# Patient Record
Sex: Male | Born: 1995 | Race: Black or African American | Hispanic: No | Marital: Single | Smoking: Current every day smoker
Health system: Southern US, Community
[De-identification: ages and names within clinical notes are randomized; demographics above are authoritative.]

## PROBLEM LIST (undated history)

## (undated) DIAGNOSIS — J45909 Unspecified asthma, uncomplicated: Secondary | ICD-10-CM

---
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
## 2014-10-02 ENCOUNTER — Emergency Department (HOSPITAL_COMMUNITY)
Admission: EM | Admit: 2014-10-02 | Discharge: 2014-10-03 | Disposition: A | Discharge status: Home / Self Care | Payer: BLUE CROSS/BLUE SHIELD | Attending: Emergency Medicine | Admitting: Emergency Medicine

## 2014-10-02 ENCOUNTER — Encounter (HOSPITAL_COMMUNITY): Payer: Self-pay | Admitting: *Deleted

## 2014-10-02 DIAGNOSIS — X58XXXA Exposure to other specified factors, initial encounter: Secondary | ICD-10-CM | POA: Diagnosis not present

## 2014-10-02 DIAGNOSIS — T148 Other injury of unspecified body region: Secondary | ICD-10-CM | POA: Insufficient documentation

## 2014-10-02 DIAGNOSIS — Z720 Tobacco use: Secondary | ICD-10-CM | POA: Insufficient documentation

## 2014-10-02 DIAGNOSIS — Z791 Long term (current) use of non-steroidal anti-inflammatories (NSAID): Secondary | ICD-10-CM | POA: Insufficient documentation

## 2014-10-02 DIAGNOSIS — T148XXA Other injury of unspecified body region, initial encounter: Secondary | ICD-10-CM

## 2014-10-02 DIAGNOSIS — S4992XA Unspecified injury of left shoulder and upper arm, initial encounter: Secondary | ICD-10-CM | POA: Insufficient documentation

## 2014-10-02 DIAGNOSIS — Y9389 Activity, other specified: Secondary | ICD-10-CM | POA: Diagnosis not present

## 2014-10-02 DIAGNOSIS — S4991XA Unspecified injury of right shoulder and upper arm, initial encounter: Secondary | ICD-10-CM | POA: Diagnosis not present

## 2014-10-02 DIAGNOSIS — Z79899 Other long term (current) drug therapy: Secondary | ICD-10-CM | POA: Insufficient documentation

## 2014-10-02 DIAGNOSIS — J45909 Unspecified asthma, uncomplicated: Secondary | ICD-10-CM | POA: Diagnosis not present

## 2014-10-02 DIAGNOSIS — Y998 Other external cause status: Secondary | ICD-10-CM | POA: Diagnosis not present

## 2014-10-02 DIAGNOSIS — S3992XA Unspecified injury of lower back, initial encounter: Secondary | ICD-10-CM | POA: Insufficient documentation

## 2014-10-02 DIAGNOSIS — Y9289 Other specified places as the place of occurrence of the external cause: Secondary | ICD-10-CM | POA: Diagnosis not present

## 2014-10-02 NOTE — ED Provider Notes (Signed)
CSN: [id number]     Arrival date & time 10/02/14  2244 History   First MD Initiated Contact with Patient 10/02/14 2339     Chief Complaint  Patient presents with  . Back Pain     (Consider location/radiation/quality/duration/timing/severity/associated sxs/prior Treatment) Patient is a 19 y.o. male presenting with back pain. The history is provided by the patient.  Back Pain Pain location: right and left shoulders/upper back. Quality:  Aching Pain severity:  Moderate Duration:  2 weeks Timing:  Intermittent Progression:  Unchanged Chronicity:  New Context: lifting heavy objects   Relieved by:  Nothing Worsened by:  Movement Ineffective treatments:  Lying down Associated symptoms: no abdominal pain, no bladder incontinence, no bowel incontinence, no chest pain, no dysuria, no paresthesias, no perianal numbness and no weakness   Risk factors: no recent surgery     Past Medical History  Diagnosis Date  . Asthma    History reviewed. No pertinent past surgical history. History reviewed. No pertinent family history. History  Substance Use Topics  . Smoking status: Current Every Day Smoker -- 1.00 packs/day  . Smokeless tobacco: Not on file  . Alcohol Use: No    Review of Systems  Constitutional: Negative for activity change.       All ROS Neg except as noted in HPI  HENT: Negative for nosebleeds.   Eyes: Negative for photophobia and discharge.  Respiratory: Negative for cough, shortness of breath and wheezing.   Cardiovascular: Negative for chest pain and palpitations.  Gastrointestinal: Negative for abdominal pain, blood in stool and bowel incontinence.  Genitourinary: Negative for bladder incontinence, dysuria, frequency and hematuria.  Musculoskeletal: Positive for back pain. Negative for arthralgias and neck pain.  Skin: Negative.   Neurological: Negative for dizziness, seizures, speech difficulty, weakness and paresthesias.  Psychiatric/Behavioral: Negative for  hallucinations and confusion.      Allergies  Review of patient's allergies indicates no known allergies.  Home Medications   Prior to Admission medications   Medication Sig Start Date End Date Taking? Authorizing Provider  HYDROcodone-acetaminophen (NORCO) 5-325 MG per tablet Take 1 tablet by mouth every 4 (four) hours as needed for pain. 02/15/12   Ivery Quale, PA-C  ibuprofen (ADVIL,MOTRIN) 800 MG tablet Take 1 tablet (800 mg total) by mouth 3 (three) times daily. 02/15/12   Ivery Quale, PA-C   BP 150/70 mmHg  Pulse 62  Temp(Src) 98.3 F (36.8 C) (Oral)  Resp 18  Ht  (1.727 m)  Wt 230 lb (104.327 kg)  BMI 34.98 kg/m2  SpO2 100% Physical Exam  Constitutional: He is oriented to person, place, and time. He appears well-developed and well-nourished.  Non-toxic appearance.  HENT:  Head: Normocephalic.  Right Ear: Tympanic membrane and external ear normal.  Left Ear: Tympanic membrane and external ear normal.  Eyes: EOM and lids are normal. Pupils are equal, round, and reactive to light.  Neck: Normal range of motion. Neck supple. Carotid bruit is not present.  Cardiovascular: Normal rate, regular rhythm, normal heart sounds, intact distal pulses and normal pulses.   Pulmonary/Chest: Breath sounds normal. No respiratory distress.  Abdominal: Soft. Bowel sounds are normal. There is no tenderness. There is no guarding.  Musculoskeletal: Normal range of motion.       Back:  Pain of both shoulders, radiating to the axilla area. No step off of the cervical or thoracic spine.  Lymphadenopathy:       Head (right side): No submandibular adenopathy present.  Head (left side): No submandibular adenopathy present.    He has no cervical adenopathy.  Neurological: He is alert and oriented to person, place, and time. He has normal strength. No cranial nerve deficit or sensory deficit.  Skin: Skin is warm and dry.  Psychiatric: He has a normal mood and affect. His speech is  normal.  Nursing note and vitals reviewed.   ED Course  Procedures (including critical care time) Labs Review Labs Reviewed - No data to display  Imaging Review No results found.   EKG Interpretation None      MDM  Exam favors Musculoskeletal pain. No gross neuro deficit.  Rx for ibuprofen and baclofen given to the patient. Pt to follow up with orthopedics if not improving.   Final diagnoses:  None    *I have reviewed nursing notes, vital signs, and all appropriate lab and imaging results for this patient.[address]    Jodey Burbano, PA-C 10/03/14 0038  Shon Batonourtney F Horton, MD 10/03/14 [phone]

## 2014-10-02 NOTE — ED Notes (Signed)
Pt reporting "discomfort" in lower to mid back.  Pt states he's had this pain "for a few weeks".

## 2014-10-03 MED ORDER — PREDNISONE 50 MG PO TABS
60.0000 mg | ORAL_TABLET | Freq: Once | ORAL | Status: AC
  Administered 2014-10-03: 60 mg via ORAL
  Filled 2014-10-03 (×2): qty 1

## 2014-10-03 MED ORDER — METHOCARBAMOL 500 MG PO TABS: 500.0000 mg | ORAL_TABLET | Freq: Three times a day (TID) | ORAL | Status: DC

## 2014-10-03 MED ORDER — IBUPROFEN 800 MG PO TABS: 800.0000 mg | ORAL_TABLET | Freq: Three times a day (TID) | ORAL | Status: DC

## 2014-10-03 MED ORDER — IBUPROFEN 800 MG PO TABS
800.0000 mg | ORAL_TABLET | Freq: Once | ORAL | Status: AC
  Administered 2014-10-03: 800 mg via ORAL
  Filled 2014-10-03: qty 1

## 2014-10-03 NOTE — ED Notes (Signed)
Discharge instructions and prescriptions given and reviewed with patient.  Patient verbalized understanding to take medication as directed and possible sedating effect of Robaxin.  Patient ambulatory; discharged home in good condition.

## 2014-10-03 NOTE — Discharge Instructions (Signed)

## 2015-02-22 ENCOUNTER — Ambulatory Visit (INDEPENDENT_AMBULATORY_CARE_PROVIDER_SITE_OTHER): Payer: BLUE CROSS/BLUE SHIELD

## 2015-02-22 ENCOUNTER — Ambulatory Visit (INDEPENDENT_AMBULATORY_CARE_PROVIDER_SITE_OTHER): Payer: BLUE CROSS/BLUE SHIELD | Admitting: Orthopedic Surgery

## 2015-02-22 VITALS — BP 145/98 | Ht 68.0 in | Wt 236.0 lb

## 2015-02-22 DIAGNOSIS — M545 Low back pain, unspecified: Secondary | ICD-10-CM | POA: Insufficient documentation

## 2015-02-22 DIAGNOSIS — M25561 Pain in right knee: Secondary | ICD-10-CM

## 2015-02-22 DIAGNOSIS — M222X1 Patellofemoral disorders, right knee: Secondary | ICD-10-CM | POA: Diagnosis not present

## 2015-02-22 DIAGNOSIS — Q666 Other congenital valgus deformities of feet: Secondary | ICD-10-CM

## 2015-02-22 NOTE — Patient Instructions (Signed)
Home exercises  Orthotics from [facility]
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
## 2015-08-18 ENCOUNTER — Emergency Department (HOSPITAL_COMMUNITY)
Admission: EM | Admit: 2015-08-18 | Discharge: 2015-08-18 | Disposition: A | Discharge status: Home / Self Care | Payer: No Typology Code available for payment source | Attending: Emergency Medicine | Admitting: Emergency Medicine

## 2015-08-18 ENCOUNTER — Encounter (HOSPITAL_COMMUNITY): Payer: Self-pay | Admitting: Emergency Medicine

## 2015-08-18 DIAGNOSIS — Y999 Unspecified external cause status: Secondary | ICD-10-CM | POA: Insufficient documentation

## 2015-08-18 DIAGNOSIS — X500XXA Overexertion from strenuous movement or load, initial encounter: Secondary | ICD-10-CM | POA: Insufficient documentation

## 2015-08-18 DIAGNOSIS — M545 Low back pain, unspecified: Secondary | ICD-10-CM

## 2015-08-18 DIAGNOSIS — T148XXA Other injury of unspecified body region, initial encounter: Secondary | ICD-10-CM

## 2015-08-18 DIAGNOSIS — Y929 Unspecified place or not applicable: Secondary | ICD-10-CM | POA: Insufficient documentation

## 2015-08-18 DIAGNOSIS — J45909 Unspecified asthma, uncomplicated: Secondary | ICD-10-CM | POA: Insufficient documentation

## 2015-08-18 DIAGNOSIS — F17200 Nicotine dependence, unspecified, uncomplicated: Secondary | ICD-10-CM | POA: Insufficient documentation

## 2015-08-18 DIAGNOSIS — Y93F2 Activity, caregiving, lifting: Secondary | ICD-10-CM | POA: Insufficient documentation

## 2015-08-18 DIAGNOSIS — S39012A Strain of muscle, fascia and tendon of lower back, initial encounter: Secondary | ICD-10-CM | POA: Insufficient documentation

## 2015-08-18 MED ORDER — DICLOFENAC SODIUM 75 MG PO TBEC: 75.0000 mg | DELAYED_RELEASE_TABLET | Freq: Two times a day (BID) | ORAL | Status: DC

## 2015-08-18 MED ORDER — METHOCARBAMOL 500 MG PO TABS: ORAL_TABLET | ORAL | Status: DC

## 2015-08-18 NOTE — ED Provider Notes (Signed)
CSN: [id number]     Arrival date & time 08/18/15  1720 History   First MD Initiated Contact with Patient 08/18/15 1741     Chief Complaint  Patient presents with  . Back Pain     (Consider location/radiation/quality/duration/timing/severity/associated sxs/prior Treatment) HPI Comments: Patient is a 20 year old male who presents to the emergency department with bilateral lower back pain.  The patient states that he has had this problem in the past,, and at that time it was believed to be related to lifting, pushing, and pulling at work. He states that the pain has returned, it Them up on last evening. He presents to the emergency department for additional evaluation. He continues to do a lot of heavy lifting on his job. He is ambulatory without problem. He denies any loss of bowel or bladder function. He's not had any frequent falls. Standing, walking, and turning same to aggravate the pain. He has not done anything that really stops the pain.  The history is provided by the patient.    Past Medical History  Diagnosis Date  . Asthma    History reviewed. No pertinent past surgical history. No family history on file. Social History  Substance Use Topics  . Smoking status: Current Every Day Smoker -- 1.00 packs/day  . Smokeless tobacco: None  . Alcohol Use: No    Review of Systems  Constitutional: Negative for activity change.       All ROS Neg except as noted in HPI  HENT: Negative for nosebleeds.   Eyes: Negative for photophobia and discharge.  Respiratory: Negative for cough, shortness of breath and wheezing.   Cardiovascular: Negative for chest pain and palpitations.  Gastrointestinal: Negative for abdominal pain and blood in stool.  Genitourinary: Negative for dysuria, frequency and hematuria.  Musculoskeletal: Positive for back pain. Negative for arthralgias and neck pain.  Skin: Negative.   Neurological: Negative for dizziness, seizures and speech difficulty.    Psychiatric/Behavioral: Negative for hallucinations and confusion.  All other systems reviewed and are negative.     Allergies  Review of patient's allergies indicates no known allergies.  Home Medications   Prior to Admission medications   Not on File   BP 126/79 mmHg  Pulse 57  Temp(Src) 98.5 F (36.9 C) (Oral)  Resp 14  Ht  (1.727 m)  Wt 90.719 kg  BMI 30.42 kg/m2  SpO2 100% Physical Exam  Constitutional: He is oriented to person, place, and time. He appears well-developed and well-nourished.  Non-toxic appearance.  HENT:  Head: Normocephalic.  Right Ear: Tympanic membrane and external ear normal.  Left Ear: Tympanic membrane and external ear normal.  Eyes: EOM and lids are normal. Pupils are equal, round, and reactive to light.  Neck: Normal range of motion. Neck supple. Carotid bruit is not present.  Cardiovascular: Normal rate, regular rhythm, normal heart sounds, intact distal pulses and normal pulses.   Pulmonary/Chest: Breath sounds normal. No respiratory distress.  Abdominal: Soft. Bowel sounds are normal. There is no tenderness. There is no guarding.  Musculoskeletal: Normal range of motion.       Arms: No palpable step off of the cervical, thoracic, or lumbar spine. No hot areas appreciated.  Lymphadenopathy:       Head (right side): No submandibular adenopathy present.       Head (left side): No submandibular adenopathy present.    He has no cervical adenopathy.  Neurological: He is alert and oriented to person, place, and time. He has normal strength.  No cranial nerve deficit or sensory deficit. Coordination and gait normal.  Skin: Skin is warm and dry.  Psychiatric: He has a normal mood and affect. His speech is normal.  Nursing note and vitals reviewed.   ED Course  Procedures (including critical care time) Labs Review Labs Reviewed - No data to display  Imaging Review No results found. I have personally reviewed and evaluated these images  and lab results as part of my medical decision-making.   EKG Interpretation None      MDM  Vital signs are well within normal limits. The gait is steady, there no gross neurologic deficits appreciated. There is no weaknesses noted of the upper or lower extremities. The examination favors muscle strain of the right greater than left back areas.  I suggested a warm tub soaks. The patient is given a prescription for Robaxin to use at bedtime, or every 8 hours for spasm pain. He is also treated with diclofenac 2 times daily. He will follow-up with orthopedics if not improving.    Final diagnoses:  None    **I have reviewed nursing notes, vital signs, and all appropriate lab and imaging results for this patient.Caleb Blevins*    Caleb Pint, PA-C 08/18/15 1850  Caleb QualeHobson Mike Hamre, PA-C 08/18/15 1851  Lavera Guiseana Duo Liu, MD 08/19/15 1357

## 2015-08-18 NOTE — ED Notes (Signed)
Pt reports bilateral lower back pain. States he does a lot of heavy lifting with his job. Pt ambulatory.

## 2015-08-18 NOTE — Discharge Instructions (Signed)
Your examination favors muscle strain involving the muscles of your back extending into the shoulder blade area. Warm tub soaks may be helpful. Please use diclofenac 2 times daily with food until all taken. Use Robaxin at bedtime, or every 8 hours if you are not required to drive, work, Advertising copywriteroperate machinery, or handle legal documents. Musculoskeletal Pain Musculoskeletal pain is muscle and boney aches and pains. These pains can occur in any part of the body. Your caregiver may treat you without knowing the cause of the pain. They may treat you if blood or urine tests, X-rays, and other tests were normal.  CAUSES There is often not a definite cause or reason for these pains. These pains may be caused by a type of germ (virus). The discomfort may also come from overuse. Overuse includes working out too hard when your body is not fit. Boney aches also come from weather changes. Bone is sensitive to atmospheric pressure changes. HOME CARE INSTRUCTIONS   Ask when your test results will be ready. Make sure you get your test results.  Only take over-the-counter or prescription medicines for pain, discomfort, or fever as directed by your caregiver. If you were given medications for your condition, do not drive, operate machinery or power tools, or sign legal documents for 24 hours. Do not drink alcohol. Do not take sleeping pills or other medications that may interfere with treatment.  Continue all activities unless the activities cause more pain. When the pain lessens, slowly resume normal activities. Gradually increase the intensity and duration of the activities or exercise.  During periods of severe pain, bed rest may be helpful. Lay or sit in any position that is comfortable.  Putting ice on the injured area.  Put ice in a bag.  Place a towel between your skin and the bag.  Leave the ice on for 15 to 20 minutes, 3 to 4 times a day.  Follow up with your caregiver for continued problems and no reason  can be found for the pain. If the pain becomes worse or does not go away, it may be necessary to repeat tests or do additional testing. Your caregiver may need to look further for a possible cause. SEEK IMMEDIATE MEDICAL CARE IF:  You have pain that is getting worse and is not relieved by medications.  You develop chest pain that is associated with shortness or breath, sweating, feeling sick to your stomach (nauseous), or throw up (vomit).  Your pain becomes localized to the abdomen.  You develop any new symptoms that seem different or that concern you. MAKE SURE YOU:   Understand these instructions.  Will watch your condition.  Will get help right away if you are not doing well or get worse.   This information is not intended to replace advice given to you by your health care provider. Make sure you discuss any questions you have with your health care provider.   Document Released: 04/30/2005 Document Revised: 07/23/2011 Document Reviewed: 01/02/2013 Elsevier Interactive Patient Education [employer]2016 [employer]

## 2015-09-25 ENCOUNTER — Encounter (HOSPITAL_COMMUNITY): Payer: Self-pay | Admitting: Emergency Medicine

## 2015-09-25 ENCOUNTER — Emergency Department (HOSPITAL_COMMUNITY)
Admission: EM | Admit: 2015-09-25 | Discharge: 2015-09-26 | Disposition: A | Discharge status: Home / Self Care | Payer: Self-pay | Attending: Emergency Medicine | Admitting: Emergency Medicine

## 2015-09-25 ENCOUNTER — Emergency Department (HOSPITAL_COMMUNITY): Payer: Self-pay

## 2015-09-25 DIAGNOSIS — Y929 Unspecified place or not applicable: Secondary | ICD-10-CM | POA: Insufficient documentation

## 2015-09-25 DIAGNOSIS — F17200 Nicotine dependence, unspecified, uncomplicated: Secondary | ICD-10-CM | POA: Insufficient documentation

## 2015-09-25 DIAGNOSIS — J45909 Unspecified asthma, uncomplicated: Secondary | ICD-10-CM | POA: Insufficient documentation

## 2015-09-25 DIAGNOSIS — Y9389 Activity, other specified: Secondary | ICD-10-CM | POA: Insufficient documentation

## 2015-09-25 DIAGNOSIS — X501XXA Overexertion from prolonged static or awkward postures, initial encounter: Secondary | ICD-10-CM | POA: Insufficient documentation

## 2015-09-25 DIAGNOSIS — S43102A Unspecified dislocation of left acromioclavicular joint, initial encounter: Secondary | ICD-10-CM | POA: Insufficient documentation

## 2015-09-25 DIAGNOSIS — Y999 Unspecified external cause status: Secondary | ICD-10-CM | POA: Insufficient documentation

## 2015-09-25 MED ORDER — HYDROCODONE-ACETAMINOPHEN 5-325 MG PO TABS: 1.0000 | ORAL_TABLET | ORAL | Status: DC | PRN

## 2015-09-25 MED ORDER — DICLOFENAC SODIUM 50 MG PO TBEC: 50.0000 mg | DELAYED_RELEASE_TABLET | Freq: Two times a day (BID) | ORAL | Status: DC

## 2015-09-25 MED ORDER — METHOCARBAMOL 500 MG PO TABS: 500.0000 mg | ORAL_TABLET | Freq: Two times a day (BID) | ORAL | Status: DC

## 2015-09-25 NOTE — ED Provider Notes (Signed)
CSN: [id number]     Arrival date & time 09/25/15  2147 History   First MD Initiated Contact with Patient 09/25/15 2226     Chief Complaint  Patient presents with  . Shoulder Pain     (Consider location/radiation/quality/duration/timing/severity/associated sxs/prior Treatment) Patient is a 20 y.o. male presenting with shoulder pain. The history is provided by the patient. No language interpreter was used.  Shoulder Pain Location:  Shoulder Time since incident:  1 week Injury: yes   Mechanism of injury comment:  Lifting Shoulder location:  L shoulder Pain details:    Quality:  Dull and aching   Radiates to:  Back   Severity:  Moderate   Onset quality:  Gradual   Progression:  Worsening Chronicity:  New Handedness:  Right-handed Dislocation: no   Foreign body present:  No foreign bodies Tetanus status:  Unknown Prior injury to area:  No Relieved by:  None tried Worsened by:  Movement Ineffective treatments:  None tried  Denice ParadiseChristopher L Dearden is a 20 y.o. male who presents to the ED with left shoulder pain that started one week ago after he was helping someone move furniture. He reports that he has taken nothing for the pain. The pain seemed to get worse when he went back to work where he does a lot of lifting.   Past Medical History  Diagnosis Date  . Asthma    History reviewed. No pertinent past surgical history. History reviewed. No pertinent family history. Social History  Substance Use Topics  . Smoking status: Current Every Day Smoker -- 1.00 packs/day  . Smokeless tobacco: None  . Alcohol Use: Yes     Comment: occasionally    Review of Systems Negative except as stated in HPI   Allergies  Review of patient's allergies indicates no known allergies.  Home Medications   Prior to Admission medications   Medication Sig Start Date End Date Taking? Authorizing Provider  diclofenac (VOLTAREN) 50 MG EC tablet Take 1 tablet (50 mg total) by mouth 2 (two) times  daily. 09/25/15   Chanette Demo Orlene OchM Vollie Brunty, NP  HYDROcodone-acetaminophen (NORCO/VICODIN) 5-325 MG tablet Take 1 tablet by mouth every 4 (four) hours as needed. 09/25/15   Quiera Diffee Orlene OchM Rethel Sebek, NP  methocarbamol (ROBAXIN) 500 MG tablet Take 1 tablet (500 mg total) by mouth 2 (two) times daily. 09/25/15   Jeffren Dombek Orlene OchM Makinzy Cleere, NP   BP 149/76 mmHg  Pulse 65  Temp(Src) 98.4 F (36.9 C) (Oral)  Resp 14  Ht 5\' 8"  (1.727 m)  Wt 88.451 kg  BMI 29.66 kg/m2  SpO2 100% Physical Exam  Constitutional: He is oriented to person, place, and time. He appears well-developed and well-nourished.  HENT:  Head: Normocephalic and atraumatic.  Eyes: EOM are normal.  Neck: Normal range of motion. Neck supple.  Cardiovascular: Normal rate and regular rhythm.   Pulmonary/Chest: Effort normal and breath sounds normal.  Musculoskeletal:       Left shoulder: He exhibits tenderness and spasm. He exhibits normal range of motion, no swelling, no crepitus, no deformity, no laceration, normal pulse and normal strength.       Arms: Radial pulses 2+, adequate circulation  Neurological: He is alert and oriented to person, place, and time. No cranial nerve deficit.  Skin: Skin is warm and dry.  Psychiatric: He has a normal mood and affect. His behavior is normal.  Nursing note and vitals reviewed.   ED Course  Procedures (including critical care time) Labs Review Labs Reviewed - No data  to display  Imaging Review Dg Shoulder Left  09/25/2015  CLINICAL DATA:  Left shoulder pain after injury a week ago. EXAM: LEFT SHOULDER - 2+ VIEW COMPARISON:  None. FINDINGS: There is slight widening of the acromioclavicular joint which may represent low grade acromioclavicular separation. Coracoclavicular spaces maintained. No evidence of acute fracture or glenohumeral dislocation otherwise identified. No focal bone lesion or bone destruction. Soft tissues are unremarkable. IMPRESSION: Slight widening of the acromioclavicular joint possibly represent low grade  acromioclavicular separation. Repeat views with weight-bearing may be useful in further evaluation depending on clinical suspicion. Electronically Signed   By: Burman Nieves M.D.   On: 09/25/2015 23:24   I have personally reviewed and evaluated these images as part of my medical decision-making.   MDM  21 y.o. male with persistent pain to the left shoulder after helping a friend move heavy furniture one week ago stable for d/c without focal neuro deficits. Shoulder immobilizer applied, ice, pain management and f/u with ortho for further evaluation of AC separation. Discussed with the patient clinical and x-ray findings and plan of care. All questioned fully answered. He will f/u with ortho or return here if any problems arise.   Final diagnoses:  AC separation, left, initial encounter       [facility] NP 09/26/15 0006  Loren Racer, MD 09/29/15 [id number]

## 2015-09-25 NOTE — Discharge Instructions (Signed)
Do not take the muscle relaxant or narcotic pain medication if you are driving or working. Wear the shoulder immobilizer for comfort. Apply ice and rest the area. Follow up with Dr. Romeo AppleHarrison.

## 2015-09-25 NOTE — ED Notes (Signed)
Patient complaining of left shoulder pain after helping move furniture 1 week ago.

## 2015-09-26 MED ORDER — HYDROCODONE-ACETAMINOPHEN 5-325 MG PO TABS
1.0000 | ORAL_TABLET | Freq: Once | ORAL | Status: AC
  Administered 2015-09-26: 1 via ORAL
  Filled 2015-09-26: qty 1

## 2015-09-26 NOTE — ED Notes (Signed)
Pt alert & oriented x4, stable gait. Patient given discharge instructions, paperwork & prescription(s). Patient informed not to drive, operate any equipment & handel any important documents 4 hours after taking pain medication. Patient  instructed to stop at the registration desk to finish any additional paperwork. Patient  verbalized understanding. Pt left department w/ no further questions. 

## 2015-10-08 ENCOUNTER — Emergency Department (HOSPITAL_COMMUNITY)
Admission: EM | Admit: 2015-10-08 | Discharge: 2015-10-08 | Disposition: A | Discharge status: Home / Self Care | Payer: Self-pay | Attending: Emergency Medicine | Admitting: Emergency Medicine

## 2015-10-08 ENCOUNTER — Encounter (HOSPITAL_COMMUNITY): Payer: Self-pay

## 2015-10-08 DIAGNOSIS — Y929 Unspecified place or not applicable: Secondary | ICD-10-CM | POA: Insufficient documentation

## 2015-10-08 DIAGNOSIS — J45909 Unspecified asthma, uncomplicated: Secondary | ICD-10-CM | POA: Insufficient documentation

## 2015-10-08 DIAGNOSIS — Y939 Activity, unspecified: Secondary | ICD-10-CM | POA: Insufficient documentation

## 2015-10-08 DIAGNOSIS — Y999 Unspecified external cause status: Secondary | ICD-10-CM | POA: Insufficient documentation

## 2015-10-08 DIAGNOSIS — S43102D Unspecified dislocation of left acromioclavicular joint, subsequent encounter: Secondary | ICD-10-CM | POA: Insufficient documentation

## 2015-10-08 DIAGNOSIS — X500XXD Overexertion from strenuous movement or load, subsequent encounter: Secondary | ICD-10-CM | POA: Insufficient documentation

## 2015-10-08 DIAGNOSIS — F17200 Nicotine dependence, unspecified, uncomplicated: Secondary | ICD-10-CM | POA: Insufficient documentation

## 2015-10-08 NOTE — ED Notes (Signed)
Helped someone move on 5/6 and came here on the 7th.  They said I had an AC separation.  Went back to work on the Thursday after that.  Started working again that following Monday, it has been bothering me since then.

## 2015-10-08 NOTE — ED Provider Notes (Signed)
CSN: [id number]     Arrival date & time 10/08/15  1952 History   First MD Initiated Contact with Patient 10/08/15 2005     Chief Complaint  Patient presents with  . Shoulder Pain     (Consider location/radiation/quality/duration/timing/severity/associated sxs/prior Treatment) HPI    Caleb Blevins is a 20 y.o. male who presents to the Emergency Department complaining of continued left shoulder pain for several weeks.  He was seen here 09/18/15 after a lifting injury and [facility] an [facility] and went back to work after the injury which he states has aggravated his injury.  He also states that he hasn't been able to follow-up with an orthopedic doctor yet.  He is requesting a work note.  Continues to take his prescribed medications with some relief.  He denies shortness of breath, chest or neck pain, numbness or weakness of the UE's.     Past Medical History  Diagnosis Date  . Asthma    History reviewed. No pertinent past surgical history. No family history on file. Social History  Substance Use Topics  . Smoking status: Current Every Day Smoker -- 1.00 packs/day  . Smokeless tobacco: None  . Alcohol Use: Yes     Comment: occasionally    Review of Systems  Constitutional: Negative for fever and chills.  Respiratory: Negative for shortness of breath.   Cardiovascular: Negative for chest pain.  Musculoskeletal: Positive for arthralgias (left shoulder pain). Negative for joint swelling and neck pain.  Skin: Negative for color change and wound.  Neurological: Negative for weakness and numbness.  All other systems reviewed and are negative.     Allergies  Review of patient's allergies indicates no known allergies.  Home Medications   Prior to Admission medications   Medication Sig Start Date End Date Taking? Authorizing Provider  diclofenac (VOLTAREN) 50 MG EC tablet Take 1 tablet (50 mg total) by mouth 2 (two) times daily. 09/25/15   Hope Orlene OchM Neese, NP   HYDROcodone-acetaminophen (NORCO/VICODIN) 5-325 MG tablet Take 1 tablet by mouth every 4 (four) hours as needed. 09/25/15   Hope Orlene OchM Neese, NP  methocarbamol (ROBAXIN) 500 MG tablet Take 1 tablet (500 mg total) by mouth 2 (two) times daily. 09/25/15   Hope Orlene OchM Neese, NP   BP 141/68 mmHg  Pulse 82  Temp(Src) 98.6 F (37 C) (Oral)  Resp 16  Ht 5\' 8"  (1.727 m)  Wt 90.719 kg  BMI 30.42 kg/m2  SpO2 97% Physical Exam  Constitutional: He is oriented to person, place, and time. He appears well-developed and well-nourished. No distress.  HENT:  Head: Atraumatic.  Neck: Normal range of motion. Neck supple.  Cardiovascular: Normal rate, regular rhythm and intact distal pulses.   Pulmonary/Chest: Effort normal and breath sounds normal. No respiratory distress. He exhibits no tenderness.  Musculoskeletal: He exhibits tenderness. He exhibits no edema.  ttp of left anterior shoulder joint.  Pain reproduced through ROM.  Also tender at scapular border.  No edema.  Radial pulse brisk, sensation intact.  Grip strength strong and symmetrical  Neurological: He is alert and oriented to person, place, and time.  Skin: Skin is warm. No erythema.  Nursing note and vitals reviewed.   ED Course  Procedures (including critical care time) Labs Review Labs Reviewed - No data to display  Imaging Review No results found. I have personally reviewed and evaluated these images and lab results as part of my medical decision-making.   EKG Interpretation None  MDM   Final diagnoses:  Acromioclavicular joint separation, left, subsequent encounter    Previous imaging reviewed by me and considered in my MDM. Pt taking previous prescribed medications.  Here tonight requesting note for work.  Tender at left [facility] joint and left trapezius muscle.  No concerning sx's for septic joint or NV deficits.  Agrees to f/u with Dr. Chase Caller, PA-C 10/09/15 1347  Vanetta Mulders, MD 10/12/15 1700

## 2015-10-20 ENCOUNTER — Encounter (HOSPITAL_COMMUNITY): Payer: Self-pay

## 2015-10-20 ENCOUNTER — Emergency Department (HOSPITAL_COMMUNITY)
Admission: EM | Admit: 2015-10-20 | Discharge: 2015-10-20 | Disposition: A | Discharge status: Home / Self Care | Payer: Self-pay | Attending: Emergency Medicine | Admitting: Emergency Medicine

## 2015-10-20 DIAGNOSIS — M25512 Pain in left shoulder: Secondary | ICD-10-CM | POA: Insufficient documentation

## 2015-10-20 DIAGNOSIS — F17200 Nicotine dependence, unspecified, uncomplicated: Secondary | ICD-10-CM | POA: Insufficient documentation

## 2015-10-20 DIAGNOSIS — J45909 Unspecified asthma, uncomplicated: Secondary | ICD-10-CM | POA: Insufficient documentation

## 2015-10-20 MED ORDER — DICLOFENAC SODIUM 1 % TD GEL: 2.0000 g | Freq: Four times a day (QID) | TRANSDERMAL | Status: DC

## 2015-10-20 NOTE — ED Notes (Signed)
Dr. Hyacinth MeekerMiller in room to see pt.

## 2015-10-20 NOTE — ED Notes (Signed)
Pt reports was seen here 2 1/2 weeks ago for shoulder pain and wants to be reevaluated today to get a note for him to go back to work.  Pt says is still having pain in left shoulder.

## 2015-10-20 NOTE — Discharge Instructions (Signed)
You may return to work [employer]

## 2015-10-20 NOTE — ED Provider Notes (Signed)
CSN: [id number]     Arrival date & time 10/20/15  1804 History   First MD Initiated Contact with Patient 10/20/15 1924     Chief Complaint  Patient presents with  . Shoulder Pain     (Consider location/radiation/quality/duration/timing/severity/associated sxs/prior Treatment) HPI  The patient has shoulder pain on the left, this occurred after lifting something heavy approximately one month ago, he has ongoing pain which is gradually improving located in the left trapezius as well as the left latissimus dorsi around the back of the shoulder. He has minimal pain with range of motion of the shoulder. He states he needs to go back to work and wants a work note allowing him to return. He has not followed up with orthopedics  Past Medical History  Diagnosis Date  . Asthma    History reviewed. No pertinent past surgical history. No family history on file. Social History  Substance Use Topics  . Smoking status: Current Every Day Smoker -- 1.00 packs/day  . Smokeless tobacco: None  . Alcohol Use: Yes     Comment: occasionally    Review of Systems  Constitutional: Negative for fever.  Neurological: Negative for numbness.      Allergies  Review of patient's allergies indicates no known allergies.  Home Medications   Prior to Admission medications   Medication Sig Start Date End Date Taking? Authorizing Provider  diclofenac (VOLTAREN) 50 MG EC tablet Take 1 tablet (50 mg total) by mouth 2 (two) times daily. 09/25/15   Hope Orlene OchM Neese, NP  diclofenac sodium (VOLTAREN) 1 % GEL Apply 2 g topically 4 (four) times daily. 10/20/15   Eber HongBrian Babetta Paterson, MD  HYDROcodone-acetaminophen (NORCO/VICODIN) 5-325 MG tablet Take 1 tablet by mouth every 4 (four) hours as needed. 09/25/15   Hope Orlene OchM Neese, NP  methocarbamol (ROBAXIN) 500 MG tablet Take 1 tablet (500 mg total) by mouth 2 (two) times daily. 09/25/15   Hope Orlene OchM Neese, NP   BP 136/77 mmHg  Pulse 61  Temp(Src) 98.6 F (37 C) (Oral)  Resp 14  Ht 5\' 8"   (1.727 m)  Wt 200 lb (90.719 kg)  BMI 30.42 kg/m2  SpO2 100% Physical Exam  Constitutional: He appears well-developed and well-nourished.  HENT:  Head: Normocephalic and atraumatic.  Eyes: Conjunctivae are normal. Right eye exhibits no discharge. Left eye exhibits no discharge.  Pulmonary/Chest: Effort normal. No respiratory distress.  Musculoskeletal: He exhibits tenderness ( No tenderness over the acromioclavicular joint on the left but has tenderness in the distal trapezius as well as around the latissimus dorsi posterior on the left).  Neurological: He is alert. Coordination normal.  Skin: Skin is warm and dry. No rash noted. He is not diaphoretic. No erythema.  Psychiatric: He has a normal mood and affect.  Nursing note and vitals reviewed.   ED Course  Procedures (including critical care time) Labs Review Labs Reviewed - No data to display  Imaging Review No results found. I have personally reviewed and evaluated these images and lab results as part of my medical decision-making.    MDM   Final diagnoses:  Left shoulder pain    Well-appearing, work note given, patient can return to work [employer]. Vital signs normal, no neurologic findings, reviewed x-ray, doubt before meals joint separation given the location of the patient's pain  voltaren Gel as needed    Eber HongBrian Kresta Templeman, MD 10/20/15 1939

## 2016-05-16 ENCOUNTER — Encounter (HOSPITAL_COMMUNITY): Payer: Self-pay | Admitting: Emergency Medicine

## 2016-05-16 ENCOUNTER — Emergency Department (HOSPITAL_COMMUNITY)
Admission: EM | Admit: 2016-05-16 | Discharge: 2016-05-16 | Disposition: A | Discharge status: Home / Self Care | Payer: Self-pay | Attending: Emergency Medicine | Admitting: Emergency Medicine

## 2016-05-16 DIAGNOSIS — J45909 Unspecified asthma, uncomplicated: Secondary | ICD-10-CM | POA: Insufficient documentation

## 2016-05-16 DIAGNOSIS — Z711 Person with feared health complaint in whom no diagnosis is made: Secondary | ICD-10-CM

## 2016-05-16 DIAGNOSIS — F17200 Nicotine dependence, unspecified, uncomplicated: Secondary | ICD-10-CM | POA: Insufficient documentation

## 2016-05-16 DIAGNOSIS — Z202 Contact with and (suspected) exposure to infections with a predominantly sexual mode of transmission: Secondary | ICD-10-CM | POA: Insufficient documentation

## 2016-05-16 DIAGNOSIS — Z79899 Other long term (current) drug therapy: Secondary | ICD-10-CM | POA: Insufficient documentation

## 2016-05-16 MED ORDER — CEFTRIAXONE SODIUM 250 MG IJ SOLR
250.0000 mg | Freq: Once | INTRAMUSCULAR | Status: AC
  Administered 2016-05-16: 250 mg via INTRAMUSCULAR
  Filled 2016-05-16: qty 250

## 2016-05-16 MED ORDER — LIDOCAINE HCL (PF) 1 % IJ SOLN
INTRAMUSCULAR | Status: AC
  Administered 2016-05-16: 0.9 mL
  Filled 2016-05-16: qty 5

## 2016-05-16 MED ORDER — AZITHROMYCIN 250 MG PO TABS
1000.0000 mg | ORAL_TABLET | Freq: Once | ORAL | Status: AC
Start: 2016-05-16 — End: 2016-05-16
  Administered 2016-05-16: 1000 mg via ORAL
  Filled 2016-05-16: qty 4

## 2016-05-16 NOTE — ED Notes (Signed)
Pt reports he got out of the shower this morning and noticed some "redness" to his penis, denies any dysuria, discharge, edema, or sores. Had unprotected sex recently.

## 2016-05-16 NOTE — ED Provider Notes (Signed)
[facility]-EMERGENCY DEPT Provider Note   CSN: [id number] Arrival date & time: 05/16/16  1129     History   Chief Complaint Chief Complaint  Patient presents with  . SEXUALLY TRANSMITTED DISEASE    HPI Caleb Blevins is a 21 y.o. male.  HPI   21 year old male presenting requesting for evaluation of potential STD. Patient states he was masturbating this morning and afterward he notice some mild skin changes to the tip of his penis which concerns him. States that is not painful or itchy but was a bit red. He denies having any penile discharge, burning urination, hematuria, testicular pain or rectal pain. He is sexually active with 2 separate sexual partners and does not use protection all the time. No prior history of STDs. No abdominal pain or back pain and no fever.  Past Medical History:  Diagnosis Date  . Asthma     Patient Active Problem List   Diagnosis Date Noted  . Midline low back pain without sciatica 02/22/2015    History reviewed. No pertinent surgical history.     Home Medications    Prior to Admission medications   Medication Sig Start Date End Date Taking? Authorizing Provider  diclofenac (VOLTAREN) 50 MG EC tablet Take 1 tablet (50 mg total) by mouth 2 (two) times daily. 09/25/15   Hope Orlene Och, NP  diclofenac sodium (VOLTAREN) 1 % GEL Apply 2 g topically 4 (four) times daily. 10/20/15   Eber Hong, MD  HYDROcodone-acetaminophen (NORCO/VICODIN) 5-325 MG tablet Take 1 tablet by mouth every 4 (four) hours as needed. 09/25/15   Hope Orlene Och, NP  methocarbamol (ROBAXIN) 500 MG tablet Take 1 tablet (500 mg total) by mouth 2 (two) times daily. 09/25/15   Hope Orlene Och, NP    Family History History reviewed. No pertinent family history.  Social History Social History  Substance Use Topics  . Smoking status: Current Every Day Smoker    Packs/day: 1.00  . Smokeless tobacco: Never Used  . Alcohol use Yes     Comment: occasionally     Allergies   Patient  has no known allergies.   Review of Systems Review of Systems  Gastrointestinal: Negative for abdominal pain.  Genitourinary: Positive for penile pain. Negative for difficulty urinating, discharge, scrotal swelling and testicular pain.     Physical Exam Updated Vital Signs BP 165/67 (BP Location: Right Arm)   Pulse 71   Temp 97.6 F (36.4 C) (Oral)   Resp 18   Ht 5\' 8"  (1.727 m)   Wt 90.7 kg   SpO2 100%   BMI 30.41 kg/m   Physical Exam  Constitutional: He appears well-developed and well-nourished. No distress.  HENT:  Head: Atraumatic.  Eyes: Conjunctivae are normal.  Neck: Neck supple.  Abdominal: Soft. There is no tenderness.  Genitourinary:  Genitourinary Comments: Chaperone present during exam. Normal circumcised penis free of lesion or rash. Normal urethral meatus without any discharge. Testicle with normal lie and nontender. Normal scrotum. Perineum is soft. No inguinal lymphadenopathy or inguinal hernia noted. No rash noted.  Neurological: He is alert.  Skin: No rash noted.  Psychiatric: He has a normal mood and affect.  Nursing note and vitals reviewed.    ED Treatments / Results  Labs (all labs ordered are listed, but only abnormal results are displayed) Labs Reviewed  RPR  HIV ANTIBODY (ROUTINE TESTING)  GC/CHLAMYDIA PROBE AMP ([place]) NOT AT [facility]    EKG  EKG Interpretation None  Radiology No results found.  Procedures Procedures (including critical care time)  Medications Ordered in ED Medications - No data to display   Initial Impression / Assessment and Plan / ED Course  I have reviewed the triage vital signs and the nursing notes.  Pertinent labs & imaging results that were available during my care of the patient were reviewed by me and considered in my medical decision making (see chart for details).  Clinical Course     BP 165/67 (BP Location: Right Arm)   Pulse 71   Temp 97.6 F (36.4 C) (Oral)   Resp 18   Ht 5'  8" (1.727 m)   Wt 90.7 kg   SpO2 100%   BMI 30.41 kg/m    Final Clinical Impressions(s) / ED Diagnoses   Final diagnoses:  Concern about STD in male without diagnosis    New Prescriptions New Prescriptions   No medications on file   12:00 PM Patient here concerning for STD. He did complain of some discomfort at the tip of his penis. On exam I do not see any abnormal rash. However, patient will receive Rocephin and Zithromax as prophylaxis for potential STD. STD panel is obtained. I encouraged patient to avoid sexual activities until symptoms completely resolved. Use protection every single time. He will need to notify partner for treatment if tested positive for STD.   Fayrene HelperBowie Shaquille Murdy, PA-C 05/16/16 1235    Donnetta HutchingBrian Cook, MD 05/19/16 [phone]

## 2016-05-16 NOTE — ED Triage Notes (Signed)
Pt reports "was taking a shower and noticed my penis didn't look right." pt denies discharge, pain,swelling. Pt reports last intercourse 05/06/16.

## 2016-05-17 LAB — GC/CHLAMYDIA PROBE AMP (~~LOC~~) NOT AT ARMC
CHLAMYDIA, DNA PROBE: NEGATIVE
NEISSERIA GONORRHEA: NEGATIVE

## 2016-05-17 LAB — HIV ANTIBODY (ROUTINE TESTING W REFLEX): HIV Screen 4th Generation wRfx: NONREACTIVE

## 2017-10-07 ENCOUNTER — Emergency Department (HOSPITAL_COMMUNITY)
Admission: EM | Admit: 2017-10-07 | Discharge: 2017-10-07 | Disposition: A | Discharge status: Home / Self Care | Payer: Self-pay | Attending: Emergency Medicine | Admitting: Emergency Medicine

## 2017-10-07 ENCOUNTER — Other Ambulatory Visit: Payer: Self-pay

## 2017-10-07 ENCOUNTER — Encounter (HOSPITAL_COMMUNITY): Payer: Self-pay | Admitting: Emergency Medicine

## 2017-10-07 DIAGNOSIS — J45909 Unspecified asthma, uncomplicated: Secondary | ICD-10-CM | POA: Insufficient documentation

## 2017-10-07 DIAGNOSIS — N4889 Other specified disorders of penis: Secondary | ICD-10-CM | POA: Insufficient documentation

## 2017-10-07 DIAGNOSIS — Z202 Contact with and (suspected) exposure to infections with a predominantly sexual mode of transmission: Secondary | ICD-10-CM | POA: Insufficient documentation

## 2017-10-07 DIAGNOSIS — F17210 Nicotine dependence, cigarettes, uncomplicated: Secondary | ICD-10-CM | POA: Insufficient documentation

## 2017-10-07 LAB — URINALYSIS, ROUTINE W REFLEX MICROSCOPIC
BILIRUBIN URINE: NEGATIVE
GLUCOSE, UA: NEGATIVE mg/dL
HGB URINE DIPSTICK: NEGATIVE
Ketones, ur: NEGATIVE mg/dL
Leukocytes, UA: NEGATIVE
Nitrite: NEGATIVE
Protein, ur: NEGATIVE mg/dL
SPECIFIC GRAVITY, URINE: 1.016 (ref 1.005–1.030)
pH: 6 (ref 5.0–8.0)

## 2017-10-07 MED ORDER — LIDOCAINE HCL (PF) 1 % IJ SOLN
INTRAMUSCULAR | Status: AC
  Administered 2017-10-07: 2 mL
  Filled 2017-10-07: qty 2

## 2017-10-07 MED ORDER — CEFTRIAXONE SODIUM 250 MG IJ SOLR
250.0000 mg | Freq: Once | INTRAMUSCULAR | Status: AC
  Administered 2017-10-07: 250 mg via INTRAMUSCULAR
  Filled 2017-10-07: qty 250

## 2017-10-07 MED ORDER — AZITHROMYCIN 250 MG PO TABS
1000.0000 mg | ORAL_TABLET | Freq: Once | ORAL | Status: AC
  Administered 2017-10-07: 1000 mg via ORAL
  Filled 2017-10-07: qty 4

## 2017-10-07 NOTE — ED Triage Notes (Signed)
Patient complaining of red spot on his penis starting today. States he has had unprotected sex recently. Denies discharge or burning with urination.

## 2017-10-07 NOTE — Discharge Instructions (Addendum)
You have been treated this evening with medications for gonorrhea and chlamydia, your cultures are still pending.  You will be notified if any of your test results are positive.  You may follow-up for future STD checks if needed at the health department.  You may apply over-the-counter 1% hydrocortisone to the affected area 3 times a day if needed.

## 2017-10-07 NOTE — ED Provider Notes (Signed)
[facility] EMERGENCY DEPARTMENT Provider Note   CSN: [id number] Arrival date & time: 10/07/17  1720     History   Chief Complaint Chief Complaint  Patient presents with  . SEXUALLY TRANSMITTED DISEASE    HPI Caleb Blevins is a 22 y.o. male.  HPI   Caleb Blevins is a 22 y.o. male who presents to the Emergency Department requesting evaluation for possible STD.  He states that he noticed a red bump to the shaft of his penis.  He states that he recently had unprotected sex.  The bump of his nontender and nonpruritic.  He denies discharge from his penis, abdominal pain, dysuria, fever, chills, pain swelling of his testicles or other rashes.   Past Medical History:  Diagnosis Date  . Asthma     Patient Active Problem List   Diagnosis Date Noted  . Midline low back pain without sciatica 02/22/2015    History reviewed. No pertinent surgical history.      Home Medications    Prior to Admission medications   Medication Sig Start Date End Date Taking? Authorizing Provider  diclofenac (VOLTAREN) 50 MG EC tablet Take 1 tablet (50 mg total) by mouth 2 (two) times daily. 09/25/15   Janne Napoleon, NP  diclofenac sodium (VOLTAREN) 1 % GEL Apply 2 g topically 4 (four) times daily. 10/20/15   Eber Hong, MD  HYDROcodone-acetaminophen (NORCO/VICODIN) 5-325 MG tablet Take 1 tablet by mouth every 4 (four) hours as needed. 09/25/15   Janne Napoleon, NP  methocarbamol (ROBAXIN) 500 MG tablet Take 1 tablet (500 mg total) by mouth 2 (two) times daily. 09/25/15   Janne Napoleon, NP    Family History History reviewed. No pertinent family history.  Social History Social History   Tobacco Use  . Smoking status: Current Every Day Smoker    Packs/day: 1.00  . Smokeless tobacco: Never Used  Substance Use Topics  . Alcohol use: Yes    Comment: occasionally  . Drug use: No     Allergies   Patient has no known allergies.   Review of Systems Review of Systems    Constitutional: Negative for activity change, appetite change, chills and fever.  Respiratory: Negative for chest tightness and shortness of breath.   Gastrointestinal: Negative for abdominal pain, nausea and vomiting.  Genitourinary: Positive for genital sores. Negative for decreased urine volume, difficulty urinating, dysuria, flank pain, frequency, hematuria, penile pain, penile swelling, scrotal swelling, testicular pain and urgency.  Musculoskeletal: Negative for back pain.  Skin: Negative for rash.  Hematological: Negative for adenopathy.  All other systems reviewed and are negative.    Physical Exam Updated Vital Signs BP (!) 152/93 (BP Location: Right Arm)   Pulse 76   Temp 98.7 F (37.1 C) (Oral)   Resp 18   Ht  (1.727 m)   Wt 99.8 kg (220 lb)   SpO2 99%   BMI 33.45 kg/m   Physical Exam  Constitutional: He is oriented to person, place, and time. He appears well-developed and well-nourished. No distress.  Cardiovascular: Normal rate and regular rhythm.  Pulmonary/Chest: Breath sounds normal.  Abdominal: Soft. He exhibits no distension. There is no tenderness.  Genitourinary: Testes normal and penis normal. Cremasteric reflex is present. Circumcised. No phimosis, paraphimosis or penile erythema. No discharge found.  Genitourinary Comments: Pinpoint flesh-colored papule to the left side of shaft of the penis.  No pustule or vesicle.  No penile discharge  Musculoskeletal: Normal range of motion. He  exhibits no tenderness.  Lymphadenopathy: No inguinal adenopathy noted on the right or left side.  Neurological: He is alert and oriented to person, place, and time. No sensory deficit.  Skin: Skin is warm.  Psychiatric: He has a normal mood and affect.  Nursing note and vitals reviewed.    ED Treatments / Results  Labs (all labs ordered are listed, but only abnormal results are displayed) Labs Reviewed  URINALYSIS, ROUTINE W REFLEX MICROSCOPIC  RPR  HIV ANTIBODY  (ROUTINE TESTING)  GC/CHLAMYDIA PROBE AMP (Lydia) NOT AT [facility]    EKG None  Radiology No results found.  Procedures Procedures (including critical care time)  Medications Ordered in ED Medications  cefTRIAXone (ROCEPHIN) injection 250 mg (has no administration in time range)  azithromycin (ZITHROMAX) tablet 1,000 mg (has no administration in time range)     Initial Impression / Assessment and Plan / ED Course  I have reviewed the triage vital signs and the nursing notes.  Pertinent labs & imaging results that were available during my care of the patient were reviewed by me and considered in my medical decision making (see chart for details).     Patient well-appearing, vitals reviewed, single flesh-colored papule to the foreskin.  No chancre or vesicle.  Patient agrees to treatment tonight with IM Rocephin and p.o. Zithromax, cultures are pending.  Patient advised that he can have future STD testing performed at the health department.  Final Clinical Impressions(s) / ED Diagnoses   Final diagnoses:  Possible exposure to STD    ED Discharge Orders    None       Pauline Aus, Cordelia Poche 10/07/17 2008    Vanetta Mulders, MD 10/10/17 [phone]

## 2017-10-09 LAB — RPR: RPR: NONREACTIVE

## 2017-10-09 LAB — GC/CHLAMYDIA PROBE AMP (~~LOC~~) NOT AT ARMC
Chlamydia: NEGATIVE
NEISSERIA GONORRHEA: NEGATIVE

## 2017-10-09 LAB — HIV ANTIBODY (ROUTINE TESTING W REFLEX): HIV SCREEN 4TH GENERATION: NONREACTIVE

## 2018-04-09 IMAGING — DX DG SHOULDER 2+V*L*
3 series · 3 of 3 positions shown · non-contrast
Comparison: None.

CLINICAL DATA: Left shoulder pain after injury a week ago.

EXAM:
LEFT SHOULDER - 2+ VIEW

[shoulder grashey]
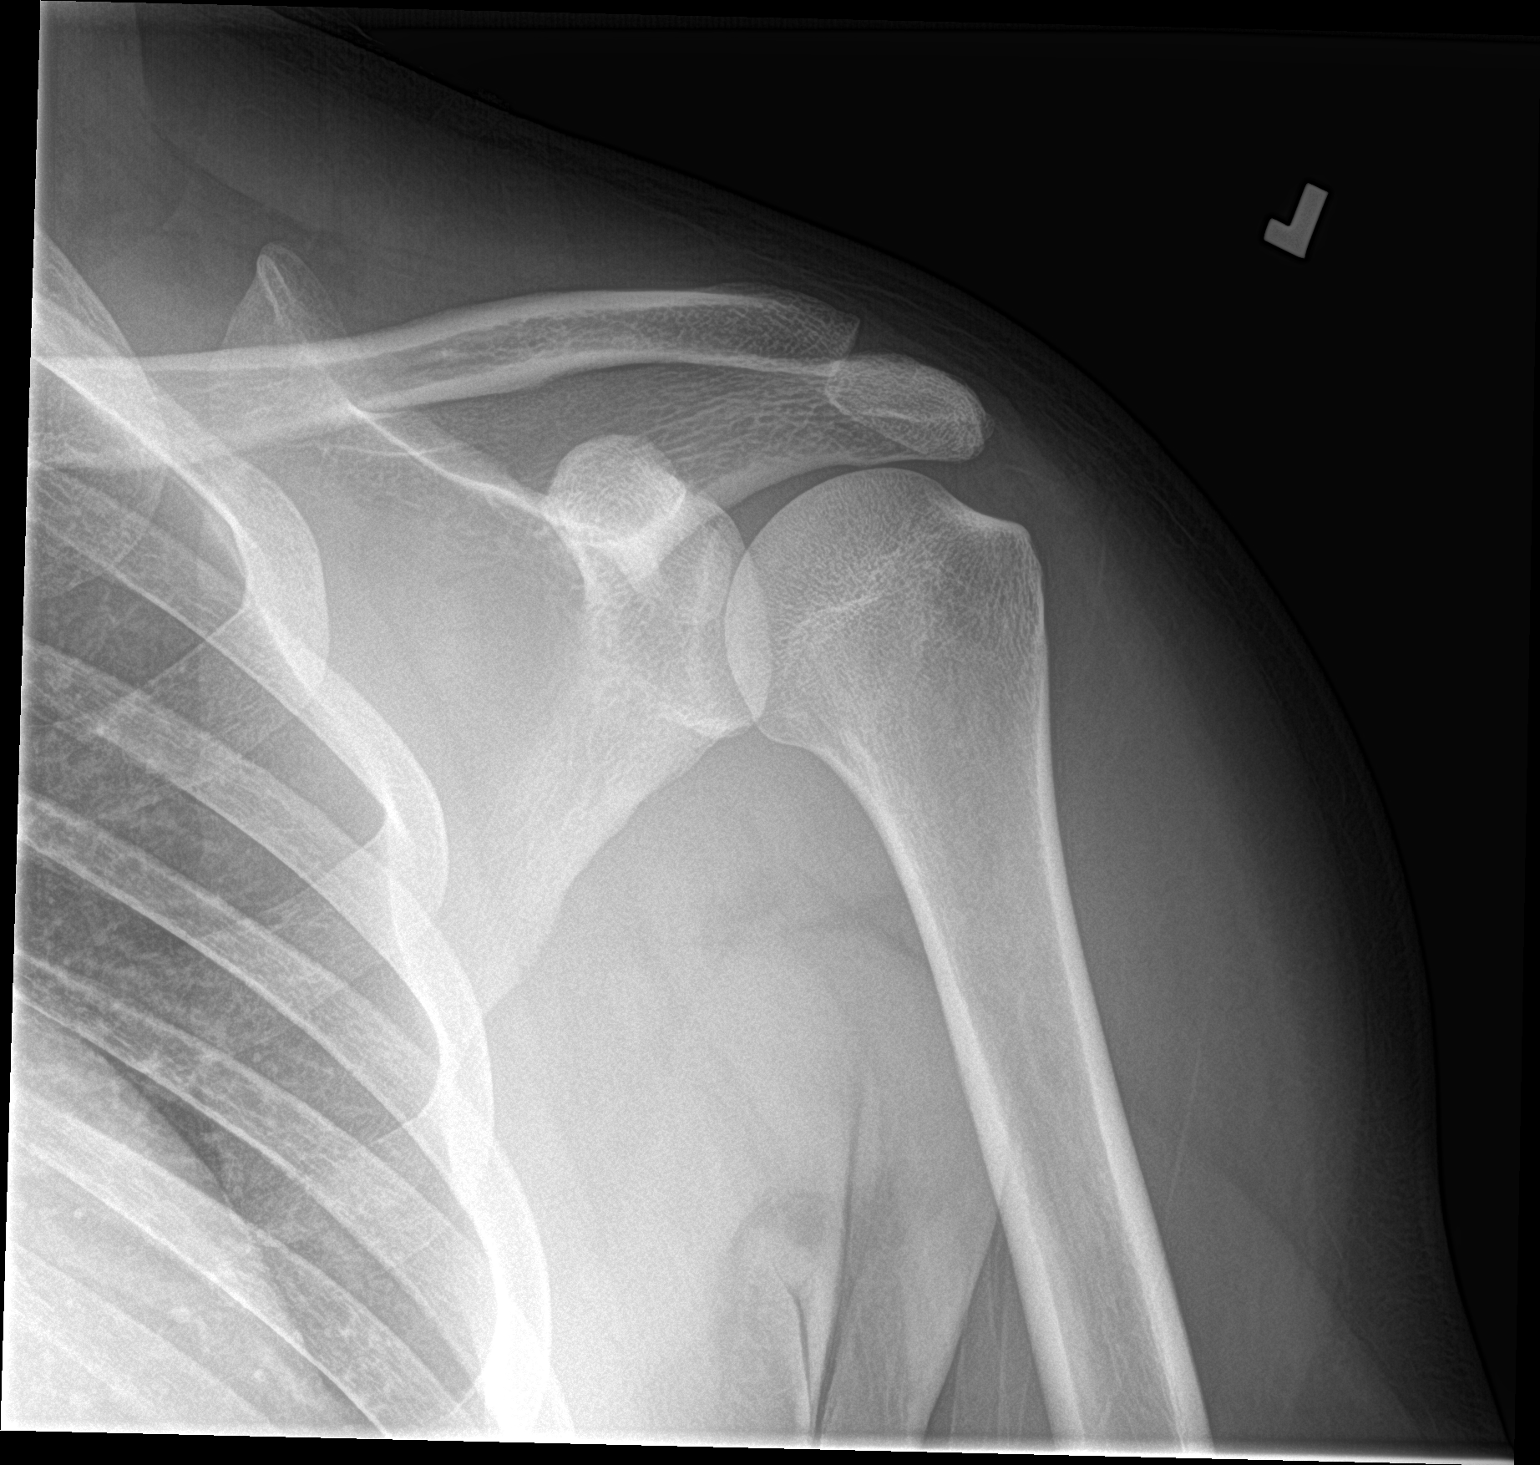

[shoulder axillary]
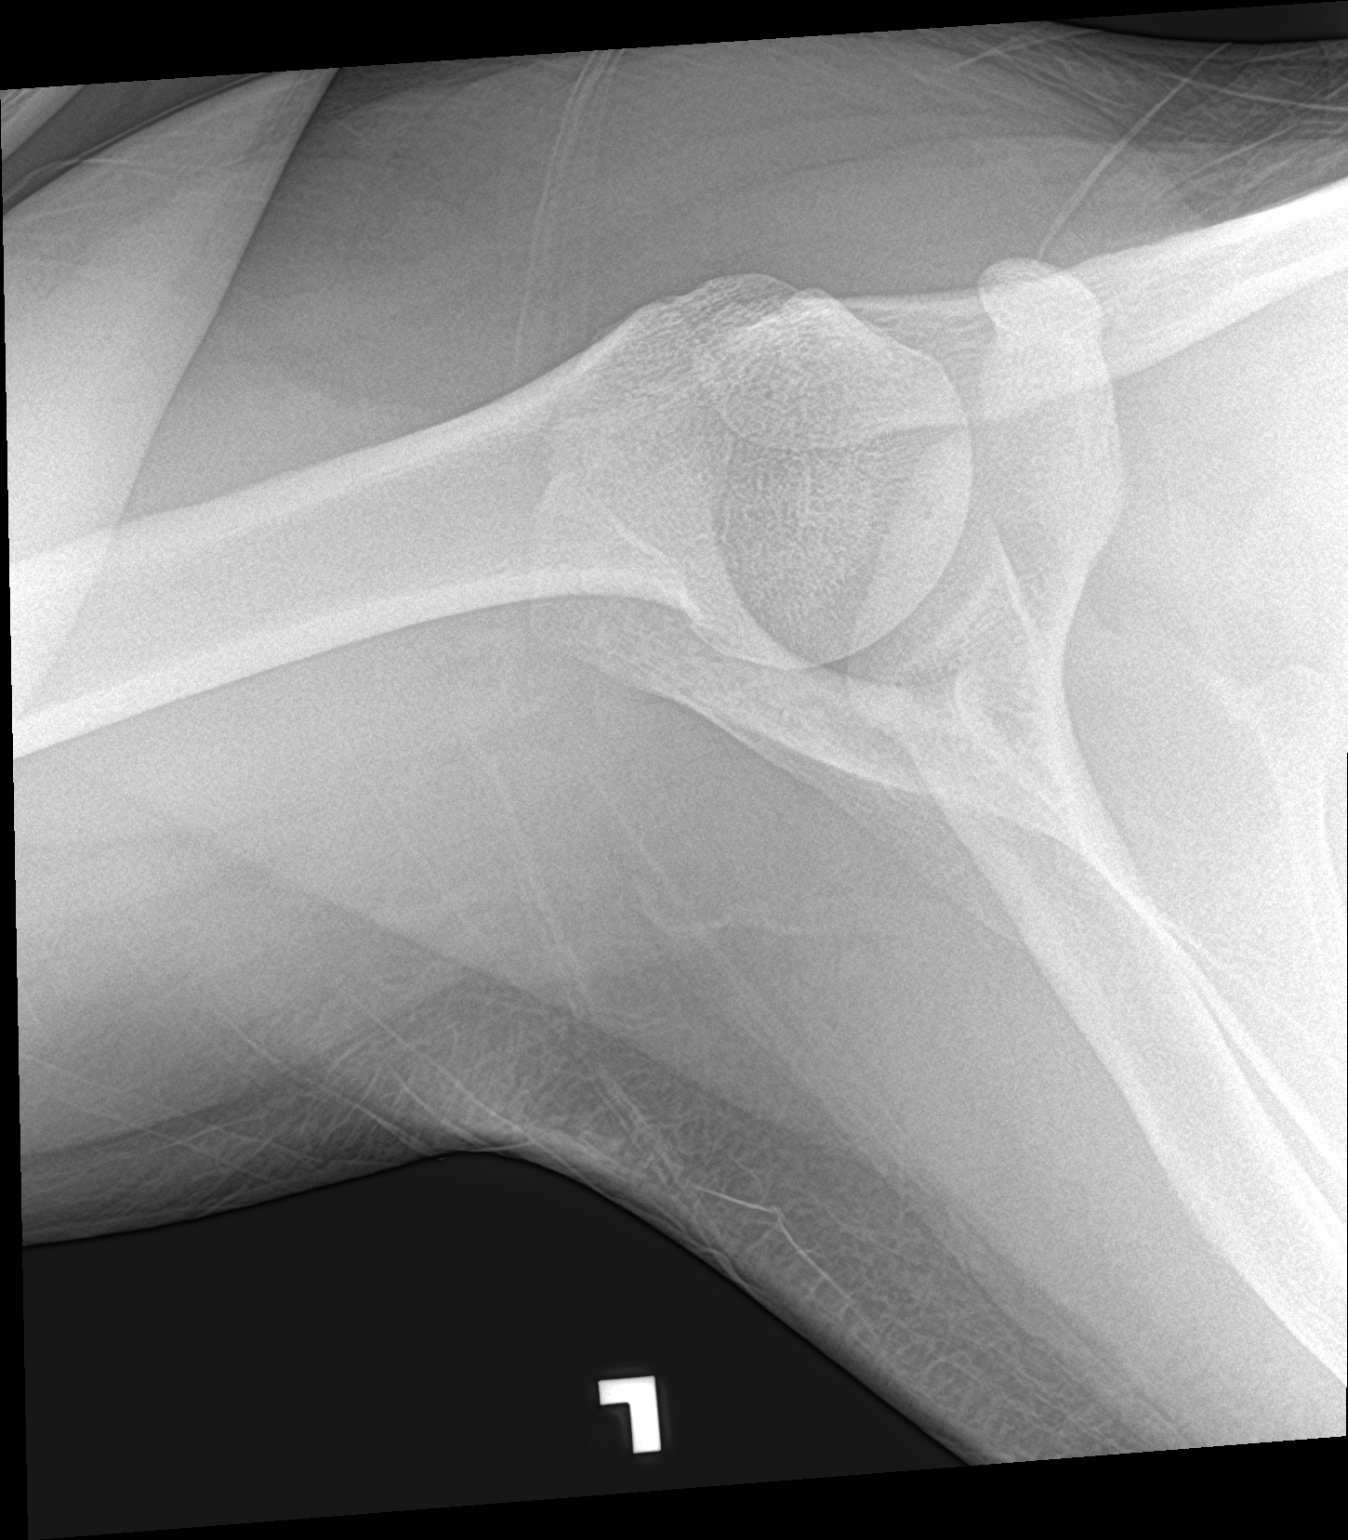

[shoulder y view]
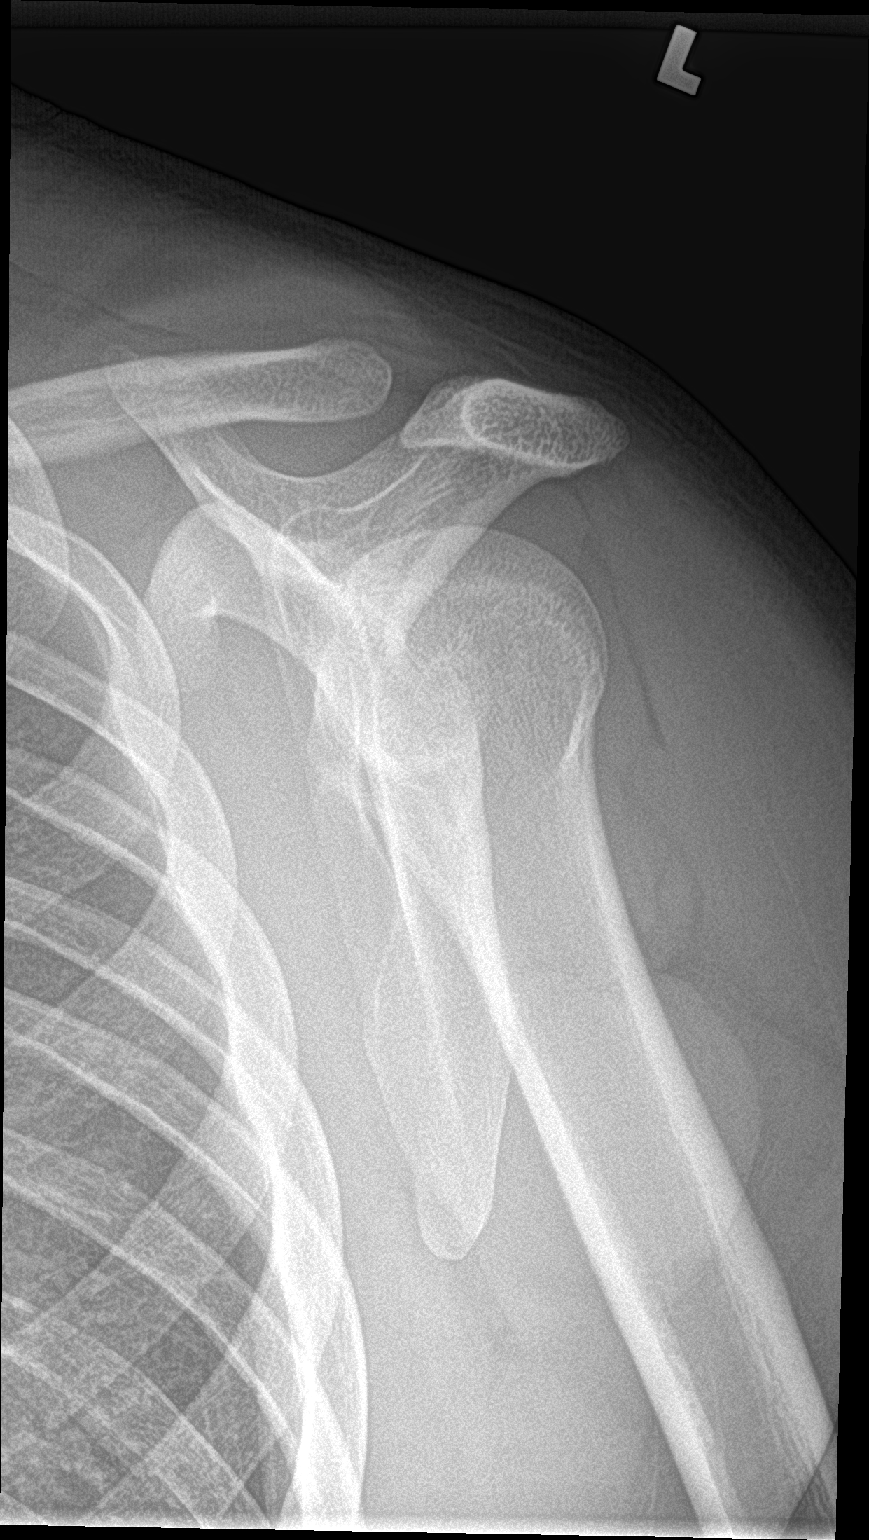

[3 of 3 positions shown; findings below may reference images not displayed]

FINDINGS: There is slight widening of the acromioclavicular joint which may
represent low grade acromioclavicular separation. Coracoclavicular
spaces maintained. No evidence of acute fracture or glenohumeral
dislocation otherwise identified. No focal bone lesion or bone
destruction. Soft tissues are unremarkable.
IMPRESSION: Slight widening of the acromioclavicular joint possibly represent
low grade acromioclavicular separation. Repeat views with
weight-bearing may be useful in further evaluation depending on
clinical suspicion.

## 2018-11-27 ENCOUNTER — Other Ambulatory Visit: Payer: Self-pay

## 2018-11-27 DIAGNOSIS — Z20822 Contact with and (suspected) exposure to covid-19: Secondary | ICD-10-CM

## 2018-12-01 LAB — NOVEL CORONAVIRUS, NAA: SARS-CoV-2, NAA: NOT DETECTED

## 2019-09-04 ENCOUNTER — Other Ambulatory Visit: Payer: Self-pay

## 2019-09-04 ENCOUNTER — Emergency Department (HOSPITAL_COMMUNITY): Payer: 59

## 2019-09-04 ENCOUNTER — Encounter (HOSPITAL_COMMUNITY): Payer: Self-pay | Admitting: Emergency Medicine

## 2019-09-04 ENCOUNTER — Emergency Department (HOSPITAL_COMMUNITY)
Admission: EM | Admit: 2019-09-04 | Discharge: 2019-09-04 | Disposition: A | Discharge status: Home / Self Care | Payer: 59 | Attending: Emergency Medicine | Admitting: Emergency Medicine

## 2019-09-04 DIAGNOSIS — M79674 Pain in right toe(s): Secondary | ICD-10-CM | POA: Insufficient documentation

## 2019-09-04 DIAGNOSIS — F17210 Nicotine dependence, cigarettes, uncomplicated: Secondary | ICD-10-CM | POA: Diagnosis not present

## 2019-09-04 NOTE — Discharge Instructions (Signed)
You can take Tylenol or Ibuprofen as directed for pain. You can alternate Tylenol and Ibuprofen every 4 hours. If you take Tylenol at 1pm, then you can take Ibuprofen at 5pm. Then you can take Tylenol again at 9pm.   Follow the RICE (Rest, Ice, Compression, Elevation) protocol as directed.   You can follow-up with referred podiatrist for further issues with your toe.  Return the emergency department for any worsening pain, redness or swelling, numbness/weakness or any other worsening or concerning symptoms.

## 2019-09-04 NOTE — ED Notes (Signed)
Ace wrap applied to patient's right big toe.

## 2019-09-04 NOTE — ED Triage Notes (Signed)
Patient presents with right great toe pain that started yesterday. Patient's toe is red and states that it is throbbing.

## 2019-09-04 NOTE — ED Provider Notes (Signed)
[facility] EMERGENCY DEPARTMENT Provider Note   CSN: [id number] Arrival date & time: 09/04/19  2052     History Chief Complaint  Patient presents with  . Toe Pain    right    Caleb Blevins is a 24 y.o. male who presents for evaluation of right first toe pain.  Patient reports that in September 2020, he had an episode where his toe got painful, red, swollen.  He states that his toe grew inward and then eventually fell off.  He states he never sought evaluation for it.  He states he since then he has been doing fine up until today when he started having pain again.  He denies any preceding trauma, injury, fall.  He reports that occasionally, when he wear shoes, the lateral aspect of his toe will rub against his shoes and get red and swollen.  He states that it will go down once he takes his shoes off.  He has not taken anything for the pain.  No fevers, numbness/weakness.  The history is provided by the patient.       Past Medical History:  Diagnosis Date  . Asthma     Patient Active Problem List   Diagnosis Date Noted  . Midline low back pain without sciatica 02/22/2015    History reviewed. No pertinent surgical history.     History reviewed. No pertinent family history.  Social History   Tobacco Use  . Smoking status: Current Every Day Smoker    Packs/day: 1.00  . Smokeless tobacco: Never Used  Substance Use Topics  . Alcohol use: Yes    Comment: occasionally  . Drug use: No    Home Medications Prior to Admission medications   Medication Sig Start Date End Date Taking? Authorizing Provider  diclofenac (VOLTAREN) 50 MG EC tablet Take 1 tablet (50 mg total) by mouth 2 (two) times daily. 09/25/15   Janne Napoleon, NP  diclofenac sodium (VOLTAREN) 1 % GEL Apply 2 g topically 4 (four) times daily. 10/20/15   Eber Hong, MD  HYDROcodone-acetaminophen (NORCO/VICODIN) 5-325 MG tablet Take 1 tablet by mouth every 4 (four) hours as needed. 09/25/15   Janne Napoleon,  NP  methocarbamol (ROBAXIN) 500 MG tablet Take 1 tablet (500 mg total) by mouth 2 (two) times daily. 09/25/15   Janne Napoleon, NP    Allergies    Patient has no known allergies.  Review of Systems   Review of Systems  Constitutional: Negative for fever.  Musculoskeletal:       Right toe pain  Skin: Positive for color change.  Neurological: Negative for weakness and numbness.  All other systems reviewed and are negative.   Physical Exam Updated Vital Signs BP (!) 150/72 (BP Location: Right Arm)   Pulse 78   Temp 98.6 F (37 C) (Oral)   Resp 18   Ht 5\' 8"  (1.727 m)   Wt 109.8 kg   SpO2 99%   BMI 36.80 kg/m   Physical Exam Vitals and nursing note reviewed.  Constitutional:      Appearance: He is well-developed.  HENT:     Head: Normocephalic and atraumatic.  Eyes:     General: No scleral icterus.       Right eye: No discharge.        Left eye: No discharge.     Conjunctiva/sclera: Conjunctivae normal.  Cardiovascular:     Pulses:          Dorsalis pedis pulses are 2+  on the right side and 2+ on the left side.  Pulmonary:     Effort: Pulmonary effort is normal.  Musculoskeletal:     Comments: Tenderness palpation of the right toe.  He has an area of dry scaly skin noted to the lateral aspect.  No overlying warmth, erythema.  No signs of paronychia.  No overlying warmth, erythema noted to the lateral proximal nailbed.  Flexion/tension of all 5 digits intact any difficulty.  No tenderness palpation of the right foot.  Skin:    General: Skin is warm and dry.     Capillary Refill: Capillary refill takes less than 2 seconds.     Comments: Onychomycosis noted to toes. Good distal cap refill.  RLE is not dusky in appearance or cool to touch.  Neurological:     Mental Status: He is alert.  Psychiatric:        Speech: Speech normal.        Behavior: Behavior normal.     ED Results / Procedures / Treatments   Labs (all labs ordered are listed, but only abnormal results  are displayed) Labs Reviewed - No data to display  EKG None  Radiology No results found.  Procedures Procedures (including critical care time)  Medications Ordered in ED Medications - No data to display  ED Course  I have reviewed the triage vital signs and the nursing notes.  Pertinent labs & imaging results that were available during my care of the patient were reviewed by me and considered in my medical decision making (see chart for details).    MDM Rules/Calculators/A&P                      24 year old male who presents for evaluation of right toe pain that began today.  No preceding trauma, injury.  He reports that occasionally he will have issues with his toe since September 2020.  He states that occasionally the lateral aspect of his toe rub consensual get red and irritated.  He currently states that he does not have any pain at this time.  No numbness/weakness.  On initially arrival, he is afebrile, nontoxic-appearing.  Vital signs are stable.  He is neurovascular intact.  States he currently does not have any pain on my examination, he has no tenderness palpation of the right toe.  On exam, he has some dry scaly skin noted lateral aspect of the toe.  Suspect that is from old blister.  No overlying warmth, erythema that would be concerning for acute infectious etiology.  Additionally, his exam is not consistent with a paronychia.  On my exam, the toe does not appear red or swollen, especially when compared to his left toe.  He does report that all symptoms have resolved at this time.  I did offer him an x-ray but patient declined.  Given that he has had no trauma, injury, I feel this is reasonable.  I suspect most likely this is being irritated from rubbing up against his shoe then will sometimes cause a blister and irritation of the skin.  He has no active blister on my evaluation.  Additionally, he does have evidence of onychomycosis.  We will plan to have patient follow-up with  podiatry. At this time, patient exhibits no emergent life-threatening condition that require further evaluation in ED. Patient had ample opportunity for questions and discussion. All patient's questions were answered with full understanding. Strict return precautions discussed. Patient expresses understanding and agreement to plan.  Portions of this note were generated with [employer]. Dictation errors may occur despite best attempts at proofreading.  Final Clinical Impression(s) / ED Diagnoses Final diagnoses:  Great toe pain, right    Rx / DC Orders ED Discharge Orders    None       Desma Mcgregor 09/04/19 2345    Mesner, Corene Cornea, MD 09/05/19 [phone]

## 2019-09-21 ENCOUNTER — Ambulatory Visit: Payer: 59 | Admitting: Podiatrist

## 2019-12-06 ENCOUNTER — Emergency Department (HOSPITAL_COMMUNITY)
Admission: EM | Admit: 2019-12-06 | Discharge: 2019-12-06 | Disposition: A | Discharge status: Home / Self Care | Payer: Managed Care, Other (non HMO) | Attending: Emergency Medicine | Admitting: Emergency Medicine

## 2019-12-06 ENCOUNTER — Other Ambulatory Visit: Payer: Self-pay

## 2019-12-06 ENCOUNTER — Encounter (HOSPITAL_COMMUNITY): Payer: Self-pay | Admitting: Emergency Medicine

## 2019-12-06 DIAGNOSIS — K921 Melena: Secondary | ICD-10-CM | POA: Insufficient documentation

## 2019-12-06 DIAGNOSIS — E669 Obesity, unspecified: Secondary | ICD-10-CM | POA: Diagnosis not present

## 2019-12-06 DIAGNOSIS — J45909 Unspecified asthma, uncomplicated: Secondary | ICD-10-CM | POA: Diagnosis not present

## 2019-12-06 DIAGNOSIS — F17210 Nicotine dependence, cigarettes, uncomplicated: Secondary | ICD-10-CM | POA: Diagnosis not present

## 2019-12-06 DIAGNOSIS — Z6836 Body mass index (BMI) 36.0-36.9, adult: Secondary | ICD-10-CM | POA: Insufficient documentation

## 2019-12-06 LAB — POC OCCULT BLOOD, ED: Fecal Occult Bld: NEGATIVE

## 2019-12-06 NOTE — Discharge Instructions (Signed)
Try Preparation H suppositories for hemorrhoids which are over the counter You can also try a stool softener such as colace which is over the counter as well to help with constipation Try to get more fiber from fruits and vegetables in the diet Please follow up with Dr. Jena Gauss (Gastroenterology) if you continues to have problem with blood in the stool

## 2019-12-06 NOTE — ED Provider Notes (Signed)
[facility] EMERGENCY DEPARTMENT Provider Note   CSN: [id number] Arrival date & time: 12/06/19  1621     History Chief Complaint  Patient presents with  . Blood In Stools    Caleb Blevins is a 24 y.o. male who presents with blood in the stool. Pt states that he had an episode of hematochezia today when he had a bowel movement. There was bright red blood when he wiped and there was also streaks of blood throughout the stool. He states this has happened one other time about a month ago but cleared up on his own and he didn't get seen at that time. He denies any fevers, abdominal pain, N/V/D. He has had problems with constipation and straining and his diet "could be better". He denies rectal pain but reports some burning when he has a BM or wipes. He denies anal sex. He has not tried anything for his symptoms. He just wanted to get checked out today because it worried him it happened again. He denies prior abdominal surgeries. No family hx of IBD or early colon/rectal cancer that he knows of.   HPI     Past Medical History:  Diagnosis Date  . Asthma     Patient Active Problem List   Diagnosis Date Noted  . Midline low back pain without sciatica 02/22/2015    History reviewed. No pertinent surgical history.     History reviewed. No pertinent family history.  Social History   Tobacco Use  . Smoking status: Current Every Day Smoker    Packs/day: 1.00    Types: Cigarettes  . Smokeless tobacco: Never Used  Vaping Use  . Vaping Use: Never used  Substance Use Topics  . Alcohol use: Yes    Comment: occasionally  . Drug use: No    Home Medications Prior to Admission medications   Medication Sig Start Date End Date Taking? Authorizing Provider  diclofenac (VOLTAREN) 50 MG EC tablet Take 1 tablet (50 mg total) by mouth 2 (two) times daily. 09/25/15   Janne Napoleon, NP  diclofenac sodium (VOLTAREN) 1 % GEL Apply 2 g topically 4 (four) times daily. 10/20/15   Eber Hong, MD  HYDROcodone-acetaminophen (NORCO/VICODIN) 5-325 MG tablet Take 1 tablet by mouth every 4 (four) hours as needed. 09/25/15   Janne Napoleon, NP  methocarbamol (ROBAXIN) 500 MG tablet Take 1 tablet (500 mg total) by mouth 2 (two) times daily. 09/25/15   Janne Napoleon, NP    Allergies    Patient has no known allergies.  Review of Systems   Review of Systems  Constitutional: Negative for chills and fever.  Gastrointestinal: Positive for blood in stool and constipation. Negative for abdominal pain, anal bleeding, diarrhea, nausea and vomiting.  All other systems reviewed and are negative.   Physical Exam Updated Vital Signs BP (!) 148/94 (BP Location: Right Arm)   Pulse 66   Temp 98.4 F (36.9 C) (Oral)   Resp 14   Ht 5\' 8"  (1.727 m)   Wt (!) 109.3 kg   SpO2 98%   BMI 36.64 kg/m   Physical Exam Vitals and nursing note reviewed.  Constitutional:      General: He is not in acute distress.    Appearance: He is well-developed. He is obese. He is not ill-appearing.     Comments: Healthy appearing male in NAD  HENT:     Head: Normocephalic and atraumatic.  Eyes:     General: No scleral icterus.  Right eye: No discharge.        Left eye: No discharge.     Conjunctiva/sclera: Conjunctivae normal.     Pupils: Pupils are equal, round, and reactive to light.  Cardiovascular:     Rate and Rhythm: Normal rate and regular rhythm.  Pulmonary:     Effort: Pulmonary effort is normal. No respiratory distress.     Breath sounds: Normal breath sounds.  Abdominal:     General: There is no distension.     Palpations: Abdomen is soft.     Tenderness: There is no abdominal tenderness.  Genitourinary:    Comments: Rectal: No gross blood, hemorrhoids, fissures, redness, area of fluctuance, lesions, or tenderness. Chaperone present during exam.  Musculoskeletal:     Cervical back: Normal range of motion.  Skin:    General: Skin is warm and dry.  Neurological:     Mental  Status: He is alert and oriented to person, place, and time.  Psychiatric:        Behavior: Behavior normal.     ED Results / Procedures / Treatments   Labs (all labs ordered are listed, but only abnormal results are displayed) Labs Reviewed  POC OCCULT BLOOD, ED    EKG None  Radiology No results found.  Procedures Procedures (including critical care time)  Medications Ordered in ED Medications - No data to display  ED Course  I have reviewed the triage vital signs and the nursing notes.  Pertinent labs & imaging results that were available during my care of the patient were reviewed by me and considered in my medical decision making (see chart for details).  24 year old male presents with an episode of hematochezia today while having a bowel movement.  It sounds like he is having intermittent scant hematochezia and the last episode was about a month ago.  He is mildly hypertensive but otherwise vital signs are normal.  There are no red flags in history or on exam.  He has a soft, benign abdomen.  Rectal exam is normal.  Hemoccult was obtained and is negative.  Do not feel like lab work or imaging is indicated today with normal vitals and reassuring exam. Patient is reporting straining with bowel movements possible he has an internal hemorrhoid.  Will attempt to treat with suppositories and stool softeners and advised to get more fiber in his diet.  He was given follow-up with GI if he continues to have bleeding.  MDM Rules/Calculators/A&P                           Final Clinical Impression(s) / ED Diagnoses Final diagnoses:  Hematochezia    Rx / DC Orders ED Discharge Orders    None       Bethel Born, PA-C 12/06/19 1727    Jacalyn Lefevre, MD 12/06/19 1732

## 2019-12-06 NOTE — ED Triage Notes (Signed)
Patient reports streaking of bright red blood in stool yesterday. Per patient "has happened once a month or two ago and didn't get seen." Patient denies any blood in stool today. Denies any abd pain, nausea, or vomiting. Per patient some burning at rectum with BM and wiping.

## 2020-01-02 ENCOUNTER — Emergency Department (HOSPITAL_COMMUNITY)
Admission: EM | Admit: 2020-01-02 | Discharge: 2020-01-02 | Disposition: A | Discharge status: Home / Self Care | Payer: Managed Care, Other (non HMO) | Attending: Emergency Medicine | Admitting: Emergency Medicine

## 2020-01-02 ENCOUNTER — Encounter (HOSPITAL_COMMUNITY): Payer: Self-pay | Admitting: *Deleted

## 2020-01-02 ENCOUNTER — Other Ambulatory Visit: Payer: Self-pay

## 2020-01-02 DIAGNOSIS — J45909 Unspecified asthma, uncomplicated: Secondary | ICD-10-CM | POA: Diagnosis not present

## 2020-01-02 DIAGNOSIS — Z23 Encounter for immunization: Secondary | ICD-10-CM | POA: Insufficient documentation

## 2020-01-02 DIAGNOSIS — L02611 Cutaneous abscess of right foot: Secondary | ICD-10-CM | POA: Insufficient documentation

## 2020-01-02 DIAGNOSIS — M79674 Pain in right toe(s): Secondary | ICD-10-CM | POA: Diagnosis present

## 2020-01-02 DIAGNOSIS — F17210 Nicotine dependence, cigarettes, uncomplicated: Secondary | ICD-10-CM | POA: Insufficient documentation

## 2020-01-02 MED ORDER — DOXYCYCLINE HYCLATE 100 MG PO CAPS: 100.0000 mg | ORAL_CAPSULE | Freq: Two times a day (BID) | ORAL | 0 refills | Status: DC

## 2020-01-02 MED ORDER — TETANUS-DIPHTH-ACELL PERTUSSIS 5-2.5-18.5 LF-MCG/0.5 IM SUSP
0.5000 mL | Freq: Once | INTRAMUSCULAR | Status: AC
  Administered 2020-01-02: 16:00:00 0.5 mL via INTRAMUSCULAR
  Filled 2020-01-02: qty 0.5

## 2020-01-02 NOTE — ED Provider Notes (Signed)
[facility] EMERGENCY DEPARTMENT Provider Note   CSN: [id number] Arrival date & time: 01/02/20  1432     History Chief Complaint  Patient presents with  . Toe Pain    Caleb Blevins is a 24 y.o. male.  Patient complains of pain in his right second toe.  The history is provided by the patient and medical records.  Toe Pain This is a new problem. The current episode started 2 days ago. The problem occurs constantly. The problem has not changed since onset.Pertinent negatives include no chest pain, no abdominal pain and no headaches. Nothing aggravates the symptoms. Nothing relieves the symptoms. He has tried nothing for the symptoms. The treatment provided no relief.       Past Medical History:  Diagnosis Date  . Asthma     Patient Active Problem List   Diagnosis Date Noted  . Midline low back pain without sciatica 02/22/2015    History reviewed. No pertinent surgical history.     No family history on file.  Social History   Tobacco Use  . Smoking status: Current Every Day Smoker    Packs/day: 1.00    Types: Cigarettes  . Smokeless tobacco: Never Used  Vaping Use  . Vaping Use: Never used  Substance Use Topics  . Alcohol use: Yes    Comment: occasionally  . Drug use: No    Home Medications Prior to Admission medications   Medication Sig Start Date End Date Taking? Authorizing Provider  diclofenac (VOLTAREN) 50 MG EC tablet Take 1 tablet (50 mg total) by mouth 2 (two) times daily. 09/25/15   Janne Napoleon, NP  diclofenac sodium (VOLTAREN) 1 % GEL Apply 2 g topically 4 (four) times daily. 10/20/15   Eber Hong, MD  doxycycline (VIBRAMYCIN) 100 MG capsule Take 1 capsule (100 mg total) by mouth 2 (two) times daily. One po bid x 7 days 01/02/20   Bethann Berkshire, MD  HYDROcodone-acetaminophen (NORCO/VICODIN) 5-325 MG tablet Take 1 tablet by mouth every 4 (four) hours as needed. 09/25/15   Janne Napoleon, NP  methocarbamol (ROBAXIN) 500 MG tablet Take 1  tablet (500 mg total) by mouth 2 (two) times daily. 09/25/15   Janne Napoleon, NP    Allergies    Patient has no known allergies.  Review of Systems   Review of Systems  Constitutional: Negative for appetite change and fatigue.  HENT: Negative for congestion, ear discharge and sinus pressure.   Eyes: Negative for discharge.  Respiratory: Negative for cough.   Cardiovascular: Negative for chest pain.  Gastrointestinal: Negative for abdominal pain and diarrhea.  Genitourinary: Negative for frequency and hematuria.  Musculoskeletal: Negative for back pain.       Right second toe pain  Skin: Negative for rash.  Neurological: Negative for seizures and headaches.  Psychiatric/Behavioral: Negative for hallucinations.    Physical Exam Updated Vital Signs BP 138/90   Pulse 85   Temp 98.3 F (36.8 C) (Oral)   Resp 18   Ht 5\' 8"  (1.727 m)   Wt 109 kg   SpO2 100%   BMI 36.54 kg/m   Physical Exam Vitals and nursing note reviewed.  Constitutional:      Appearance: He is well-developed.  HENT:     Head: Normocephalic.     Nose: Nose normal.  Eyes:     Conjunctiva/sclera: Conjunctivae normal.  Neck:     Trachea: No tracheal deviation.  Cardiovascular:     Rate and Rhythm: Normal rate.  Pulmonary:     Effort: Pulmonary effort is normal.  Musculoskeletal:        General: Normal range of motion.     Cervical back: Normal range of motion.     Comments: Right second toe has a small abscess distally  Skin:    General: Skin is warm.  Neurological:     Mental Status: He is alert and oriented to person, place, and time.     ED Results / Procedures / Treatments   Labs (all labs ordered are listed, but only abnormal results are displayed) Labs Reviewed - No data to display  EKG None  Radiology No results found.  Procedures .Marland KitchenIncision and Drainage  Date/Time: 01/04/2020 10:33 AM Performed by: Bethann Berkshire, MD Authorized by: Bethann Berkshire, MD   Comments:     Patient  had a small abscess distally on his right second toe.  Area was cleaned thoroughly with alcohol.  A #21-gauge needle was used to open up the small abscess and pus came out.  Patient tolerated the procedure well    (including critical care time)  Medications Ordered in ED Medications  Tdap (BOOSTRIX) injection 0.5 mL (has no administration in time range)    ED Course  I have reviewed the triage vital signs and the nursing notes.  Pertinent labs & imaging results that were available during my care of the patient were reviewed by me and considered in my medical decision making (see chart for details).    MDM Rules/Calculators/A&P                          Patient with a small abscess right second toe. Pus was removed with an 21-gauge needle. Patient was placed on doxycycline and will follow up with the podiatrist       This patient presents to the ED for concern of toe pain this involves an extensive number of treatment options, and is a complaint that carries with it a high risk of complications and morbidity.  The differential diagnosis includes abscess to toe fracture  Lab Tests:     Medicines ordered:  I ordered medication tetanus shot Imaging Studies ordered:   Additional history obtained:   Additional history obtained from record  Previous records obtained and reviewed.  Consultations Obtained:    Reevaluation:  After the interventions stated above, I reevaluated the patient and found improved  Critical Interventions:  .   Final Clinical Impression(s) / ED Diagnoses Final diagnoses:  Toe abscess, right    Rx / DC Orders ED Discharge Orders         Ordered    doxycycline (VIBRAMYCIN) 100 MG capsule  2 times daily        01/02/20 7341           Bethann Berkshire, MD 01/04/20 1035

## 2020-01-02 NOTE — Discharge Instructions (Addendum)
Follow up with dr. Nolen Mu next week

## 2020-01-02 NOTE — ED Triage Notes (Signed)
Pt states that his right great toenail came off two days ago, c/o right toe  pain with walking

## 2020-01-30 ENCOUNTER — Emergency Department (HOSPITAL_COMMUNITY)
Admission: EM | Admit: 2020-01-30 | Discharge: 2020-01-30 | Disposition: A | Discharge status: Home / Self Care | Payer: Managed Care, Other (non HMO) | Attending: Emergency Medicine | Admitting: Emergency Medicine

## 2020-01-30 ENCOUNTER — Other Ambulatory Visit: Payer: Self-pay

## 2020-01-30 ENCOUNTER — Encounter (HOSPITAL_COMMUNITY): Payer: Self-pay

## 2020-01-30 DIAGNOSIS — Z202 Contact with and (suspected) exposure to infections with a predominantly sexual mode of transmission: Secondary | ICD-10-CM

## 2020-01-30 DIAGNOSIS — J45909 Unspecified asthma, uncomplicated: Secondary | ICD-10-CM | POA: Insufficient documentation

## 2020-01-30 DIAGNOSIS — F17210 Nicotine dependence, cigarettes, uncomplicated: Secondary | ICD-10-CM | POA: Diagnosis not present

## 2020-01-30 LAB — URINALYSIS, ROUTINE W REFLEX MICROSCOPIC
Bacteria, UA: NONE SEEN
Bilirubin Urine: NEGATIVE
Glucose, UA: NEGATIVE mg/dL
Ketones, ur: NEGATIVE mg/dL
Leukocytes,Ua: NEGATIVE
Nitrite: NEGATIVE
Protein, ur: 30 mg/dL — AB
Specific Gravity, Urine: 1.014 (ref 1.005–1.030)
pH: 6 (ref 5.0–8.0)

## 2020-01-30 MED ORDER — METRONIDAZOLE 500 MG PO TABS
2000.0000 mg | ORAL_TABLET | Freq: Once | ORAL | Status: AC
  Administered 2020-01-30: 2000 mg via ORAL
  Filled 2020-01-30: qty 4

## 2020-01-30 NOTE — ED Provider Notes (Signed)
[facility] EMERGENCY DEPARTMENT Provider Note   CSN: [id number] Arrival date & time: 01/30/20  0340     History Chief Complaint  Patient presents with  . Exposure to STD    Caleb Blevins is a 24 y.o. male.  Caleb Blevins is a 24 yr old male with past medical history of asthma presents with concern for STD.  Patient's partner called him yesterday explaining that she had tested positive for trichomonas.  She said that the rest of the STD tests will come back on Monday.  Patient has had 2 sexual male partners over the last month, with 1 partner he received oral with the second most recent partner he received oral and had penetrative intercourse.  He did not use condoms.  He was recently tested for STDs in May which were negative.  Denies any symptoms such as dysuria, frequency, fever fevers, rash and genital lesions.         Past Medical History:  Diagnosis Date  . Asthma     Patient Active Problem List   Diagnosis Date Noted  . Midline low back pain without sciatica 02/22/2015    History reviewed. No pertinent surgical history.     No family history on file.  Social History   Tobacco Use  . Smoking status: Current Every Day Smoker    Packs/day: 1.00    Types: Cigarettes  . Smokeless tobacco: Never Used  Vaping Use  . Vaping Use: Never used  Substance Use Topics  . Alcohol use: Yes    Comment: occasionally  . Drug use: No    Home Medications Prior to Admission medications   Medication Sig Start Date End Date Taking? Authorizing Provider  diclofenac (VOLTAREN) 50 MG EC tablet Take 1 tablet (50 mg total) by mouth 2 (two) times daily. 09/25/15   Janne Napoleon, NP  diclofenac sodium (VOLTAREN) 1 % GEL Apply 2 g topically 4 (four) times daily. 10/20/15   Eber Hong, MD  doxycycline (VIBRAMYCIN) 100 MG capsule Take 1 capsule (100 mg total) by mouth 2 (two) times daily. One po bid x 7 days 01/02/20   Bethann Berkshire, MD  HYDROcodone-acetaminophen  (NORCO/VICODIN) 5-325 MG tablet Take 1 tablet by mouth every 4 (four) hours as needed. 09/25/15   Janne Napoleon, NP  methocarbamol (ROBAXIN) 500 MG tablet Take 1 tablet (500 mg total) by mouth 2 (two) times daily. 09/25/15   Janne Napoleon, NP    Allergies    Patient has no known allergies.  Review of Systems   Review of Systems  Genitourinary: Negative for discharge, dysuria, enuresis, flank pain, frequency, genital sores, hematuria, penile pain, penile swelling, scrotal swelling, testicular pain and urgency.    Physical Exam Updated Vital Signs BP (!) 148/112 (BP Location: Right Arm)   Pulse 89   Temp 98.6 F (37 C) (Oral)   Resp 16   Ht 5\' 8"  (1.727 m)   Wt 108.9 kg   SpO2 98%   BMI 36.49 kg/m   Physical Exam Constitutional:      Appearance: Normal appearance.  HENT:     Head: Normocephalic and atraumatic.  Pulmonary:     Effort: Pulmonary effort is normal.  Genitourinary:    Penis: Normal. No phimosis or paraphimosis.      Testes: Normal.     Epididymis:     Right: Normal.     Left: Normal.     Comments: Normal genital exam, chaperoned by RN .  Swabs obtained for  Gc, chlamydia and trichomonas.  Musculoskeletal:     Cervical back: Normal range of motion and neck supple.  Skin:    General: Skin is warm and dry.  Neurological:     General: No focal deficit present.     Mental Status: He is alert and oriented to person, place, and time.  Psychiatric:        Mood and Affect: Mood normal.        Behavior: Behavior normal.     ED Results / Procedures / Treatments   Labs (all labs ordered are listed, but only abnormal results are displayed) Labs Reviewed  URINALYSIS, ROUTINE W REFLEX MICROSCOPIC - Abnormal; Notable for the following components:      Result Value   Hgb urine dipstick SMALL (*)    Protein, ur 30 (*)    All other components within normal limits  GC/CHLAMYDIA PROBE AMP ([place]) NOT AT [facility]    EKG None  Radiology No results  found.  Procedures Procedures (including critical care time)  Medications Ordered in ED Medications  metroNIDAZOLE (FLAGYL) tablet 2,000 mg (2,000 mg Oral Given 01/30/20 0919)    ED Course  I have reviewed the triage vital signs and the nursing notes.  Pertinent labs & imaging results that were available during my care of the patient were reviewed by me and considered in my medical decision making (see chart for details).    MDM Rules/Calculators/A&P                          [name] is a 24 yr old male with past medical history of asthma presents with concern for STD with recent exposure to trichomonas from 1 of 2 sexual partners.  Patient is asymptomatic.  Genital exam supervised by RN Bonita Quin showed no obvious lesions or discharge.  Patient was swabbed for GC, chlamydia and trichomonas.  He was treated with 2 g of metronidazole to cover for trichomonas in the ER.  Discharged with safety precautions and recommended follow-up at the health department for his results and to obtain RPR and HIV.  Also recommended he avoids alcohol while he is on the treatment and avoid sexual intercourse until his partner has also received full treatment.  Patient expressed understanding and is happy with the plan.  Final Clinical Impression(s) / ED Diagnoses Final diagnoses:  STD exposure    Rx / DC Orders ED Discharge Orders    None       Towanda Octave, MD 01/30/20 1020    Blane Ohara, MD 01/31/20 1450

## 2020-01-30 NOTE — ED Triage Notes (Signed)
Pt presents to the emergency room stating he wants to be tested for STD. Pt says his "girl just hit him up and told me that she had trich."

## 2020-01-30 NOTE — Discharge Instructions (Addendum)
We have treated you for trich today. We have sent off samples to test for gonorrhea, chlamyadia and gonorrhea. Someone will contact you if they are positive. Please call if you have not heard back in a few days. Please get HIV and syphillis lab draw at the health department.   Avoid sexual intercourse until yourself and your partner have been treated. Please also avoid alcohol intake for the next week. Please always use condoms to prevent STDs.

## 2020-10-10 ENCOUNTER — Ambulatory Visit (HOSPITAL_COMMUNITY)
Admission: EM | Admit: 2020-10-10 | Discharge: 2020-10-10 | Disposition: A | Discharge status: Home / Self Care | Payer: Managed Care, Other (non HMO) | Attending: Emergency Medicine | Admitting: Emergency Medicine

## 2020-10-10 ENCOUNTER — Encounter (HOSPITAL_COMMUNITY): Payer: Self-pay

## 2020-10-10 ENCOUNTER — Other Ambulatory Visit: Payer: Self-pay

## 2020-10-10 DIAGNOSIS — L259 Unspecified contact dermatitis, unspecified cause: Secondary | ICD-10-CM | POA: Diagnosis not present

## 2020-10-10 MED ORDER — TRIAMCINOLONE ACETONIDE 0.1 % EX CREA: 1.0000 "application " | TOPICAL_CREAM | Freq: Two times a day (BID) | CUTANEOUS | 0 refills | Status: DC

## 2020-10-10 NOTE — Discharge Instructions (Addendum)
Apply triamcinolone twice daily. Continue to use emollient such as Vaseline or Aquaphor at night to keep hands moisturized.

## 2020-10-10 NOTE — ED Provider Notes (Signed)
[facility]  ____________________________________________  Time seen: Approximately 2:26 PM  I have reviewed the triage vital signs and the nursing notes.   HISTORY  Chief Complaint Rash   Historian Patient     HPI Caleb Blevins is a 25 y.o. male presents to the urgent care with a rash that seems to be resolving along the dorsal aspect of the bilateral hands for 1 month.  Patient associates rash with the use of boxes at work and states that he only has a rash where skin is in contact with the boxes.  He has been using Aveeno at night which has helped some.   Past Medical History:  Diagnosis Date  . Asthma      Immunizations up to date:  Yes.     Past Medical History:  Diagnosis Date  . Asthma     Patient Active Problem List   Diagnosis Date Noted  . Midline low back pain without sciatica 02/22/2015    History reviewed. No pertinent surgical history.  Prior to Admission medications   Medication Sig Start Date End Date Taking? Authorizing Provider  triamcinolone cream (KENALOG) 0.1 % Apply 1 application topically 2 (two) times daily. 10/10/20  Yes Pia Mau M, PA-C  diclofenac (VOLTAREN) 50 MG EC tablet Take 1 tablet (50 mg total) by mouth 2 (two) times daily. 09/25/15   Janne Napoleon, NP  diclofenac sodium (VOLTAREN) 1 % GEL Apply 2 g topically 4 (four) times daily. 10/20/15   Eber Hong, MD  doxycycline (VIBRAMYCIN) 100 MG capsule Take 1 capsule (100 mg total) by mouth 2 (two) times daily. One po bid x 7 days 01/02/20   Bethann Berkshire, MD  HYDROcodone-acetaminophen (NORCO/VICODIN) 5-325 MG tablet Take 1 tablet by mouth every 4 (four) hours as needed. 09/25/15   Janne Napoleon, NP  methocarbamol (ROBAXIN) 500 MG tablet Take 1 tablet (500 mg total) by mouth 2 (two) times daily. 09/25/15   Janne Napoleon, NP    Allergies Patient has no known allergies.  History reviewed. No pertinent family history.  Social History Social History   Tobacco  Use  . Smoking status: Current Every Day Smoker    Packs/day: 1.00    Types: Cigarettes  . Smokeless tobacco: Never Used  Vaping Use  . Vaping Use: Never used  Substance Use Topics  . Alcohol use: Yes    Comment: occasionally  . Drug use: No     Review of Systems  Constitutional: No fever/chills Eyes:  No discharge ENT: No upper respiratory complaints. Respiratory: no cough. No SOB/ use of accessory muscles to breath Gastrointestinal:   No nausea, no vomiting.  No diarrhea.  No constipation. Musculoskeletal: Negative for musculoskeletal pain. Skin: Patient has rash.     ____________________________________________   PHYSICAL EXAM:  VITAL SIGNS: ED Triage Vitals  Enc Vitals Group     BP 10/10/20 1328 137/85     Pulse Rate 10/10/20 1328 77     Resp 10/10/20 1328 18     Temp 10/10/20 1328 98.7 F (37.1 C)     Temp Source 10/10/20 1328 Oral     SpO2 10/10/20 1328 98 %     Weight --      Height --      Head Circumference --      Peak Flow --      Pain Score 10/10/20 1326 0     Pain Loc --      Pain Edu? --  Excl. in GC? --      Constitutional: Alert and oriented. Well appearing and in no acute distress. Eyes: Conjunctivae are normal. PERRL. EOMI. Head: Atraumatic. ENT:      Nose: No congestion/rhinnorhea.      Mouth/Throat: Mucous membranes are moist.  Neck: No stridor.  No cervical spine tenderness to palpation. Cardiovascular: Normal rate, regular rhythm. Normal S1 and S2.  Good peripheral circulation. Respiratory: Normal respiratory effort without tachypnea or retractions. Lungs CTAB. Good air entry to the bases with no decreased or absent breath sounds Gastrointestinal: Bowel sounds x 4 quadrants. Soft and nontender to palpation. No guarding or rigidity. No distention. Musculoskeletal: Full range of motion to all extremities. No obvious deformities noted Neurologic:  Normal for age. No gross focal neurologic deficits are appreciated.  Skin: Patient  has scaling, dry rash that is nonerythematous along the dorsal aspect of the bilateral hands.  No rash between the webspaces of the fingers.  No burrowing. Psychiatric: Mood and affect are normal for age. Speech and behavior are normal.   ____________________________________________   LABS (all labs ordered are listed, but only abnormal results are displayed)  Labs Reviewed - No data to display ____________________________________________  EKG   ____________________________________________  RADIOLOGY   No results found.  ____________________________________________    PROCEDURES  Procedure(s) performed:     Procedures     Medications - No data to display   ____________________________________________   INITIAL IMPRESSION / ASSESSMENT AND PLAN / ED COURSE  Pertinent labs & imaging results that were available during my care of the patient were reviewed by me and considered in my medical decision making (see chart for details).      Assessment and plan Contact dermatitis 25 year old male presents to the urgent care with rash to the dorsal aspect of the bilateral hands for 1 month.  History and physical exam findings suggest contact dermatitis.  Patient was prescribed topical triamcinolone and advised to keep affected areas moisturized with an emollient such as Vaseline, Aquaphor or the Aveeno that he is currently using.  Return precautions were given to return with new or worsening.  All patient questions were answered.     ____________________________________________  FINAL CLINICAL IMPRESSION(S) / ED DIAGNOSES  Final diagnoses:  Contact dermatitis, unspecified contact dermatitis type, unspecified trigger      NEW MEDICATIONS STARTED DURING THIS VISIT:  ED Discharge Orders         Ordered    triamcinolone cream (KENALOG) 0.1 %  2 times daily        10/10/20 1340              This chart was dictated using voice recognition software/Dragon.  Despite best efforts to proofread, errors can occur which can change the meaning. Any change was purely unintentional.     Orvil Feil, PA-C 10/10/20 1429

## 2020-10-10 NOTE — ED Triage Notes (Signed)
Pt in with c/o rash to both hands x 1 month  Pt states he has been using aveeno with no relief

## 2020-11-04 ENCOUNTER — Ambulatory Visit (HOSPITAL_COMMUNITY)
Admission: EM | Admit: 2020-11-04 | Discharge: 2020-11-04 | Disposition: A | Discharge status: Home / Self Care | Payer: Managed Care, Other (non HMO) | Attending: Urgent Care | Admitting: Urgent Care

## 2020-11-04 DIAGNOSIS — J069 Acute upper respiratory infection, unspecified: Secondary | ICD-10-CM | POA: Diagnosis not present

## 2020-11-04 DIAGNOSIS — J029 Acute pharyngitis, unspecified: Secondary | ICD-10-CM | POA: Diagnosis not present

## 2020-11-04 LAB — POCT RAPID STREP A, ED / UC: Streptococcus, Group A Screen (Direct): NEGATIVE

## 2020-11-04 MED ORDER — PROMETHAZINE-DM 6.25-15 MG/5ML PO SYRP: 5.0000 mL | ORAL_SOLUTION | Freq: Every evening | ORAL | 0 refills | Status: DC | PRN

## 2020-11-04 MED ORDER — CETIRIZINE HCL 10 MG PO TABS: 10.0000 mg | ORAL_TABLET | Freq: Every day | ORAL | 0 refills | Status: DC

## 2020-11-04 MED ORDER — PSEUDOEPHEDRINE HCL 60 MG PO TABS: 60.0000 mg | ORAL_TABLET | Freq: Three times a day (TID) | ORAL | 0 refills | Status: DC | PRN

## 2020-11-04 MED ORDER — BENZONATATE 100 MG PO CAPS: 100.0000 mg | ORAL_CAPSULE | Freq: Three times a day (TID) | ORAL | 0 refills | Status: DC | PRN

## 2020-11-04 MED ORDER — NAPROXEN 500 MG PO TABS: 500.0000 mg | ORAL_TABLET | Freq: Two times a day (BID) | ORAL | 0 refills | Status: DC

## 2020-11-04 NOTE — ED Triage Notes (Signed)
Pt presents with sore throat and cough xs 2-3 days.

## 2020-11-04 NOTE — ED Provider Notes (Signed)
[facility]   MRN: [id number] DOB: [birth year]  Subjective:   Caleb Blevins is a 25 y.o. male presenting for 2 to 3-day history of acute onset throat pain, coughing, runny and stuffy nose.  Patient took 2 at home COVID tests and both were negative.  He has a history of asthma but has not had any chest pain, shortness of breath, wheezing.  Has not needed to use an albuterol inhaler in a month.  Denies body aches, ear drainage, ear pain, sinus pain.  Patient is a smoker.  No current facility-administered medications for this encounter.  Current Outpatient Medications:    diclofenac (VOLTAREN) 50 MG EC tablet, Take 1 tablet (50 mg total) by mouth 2 (two) times daily., Disp: 15 tablet, Rfl: 0   diclofenac sodium (VOLTAREN) 1 % GEL, Apply 2 g topically 4 (four) times daily., Disp: 100 g, Rfl: 0   doxycycline (VIBRAMYCIN) 100 MG capsule, Take 1 capsule (100 mg total) by mouth 2 (two) times daily. One po bid x 7 days, Disp: 14 capsule, Rfl: 0   HYDROcodone-acetaminophen (NORCO/VICODIN) 5-325 MG tablet, Take 1 tablet by mouth every 4 (four) hours as needed., Disp: 10 tablet, Rfl: 0   methocarbamol (ROBAXIN) 500 MG tablet, Take 1 tablet (500 mg total) by mouth 2 (two) times daily., Disp: 20 tablet, Rfl: 0   triamcinolone cream (KENALOG) 0.1 %, Apply 1 application topically 2 (two) times daily., Disp: 30 g, Rfl: 0   No Known Allergies  Past Medical History:  Diagnosis Date   Asthma      No past surgical history on file.  No family history on file.  Social History   Tobacco Use   Smoking status: Every Day    Packs/day: 1.00    Pack years: 0.00    Types: Cigarettes   Smokeless tobacco: Never  Vaping Use   Vaping Use: Never used  Substance Use Topics   Alcohol use: Yes    Comment: occasionally   Drug use: No    ROS   Objective:   Vitals: BP 138/84 (BP Location: Left Arm)   Pulse 81   Temp 98.2 F (36.8 C) (Oral)   Resp 16   SpO2 97%   Physical  Exam Constitutional:      General: He is not in acute distress.    Appearance: Normal appearance. He is well-developed and normal weight. He is not ill-appearing, toxic-appearing or diaphoretic.  HENT:     Head: Normocephalic and atraumatic.     Right Ear: Tympanic membrane, ear canal and external ear normal. There is no impacted cerumen.     Left Ear: Tympanic membrane, ear canal and external ear normal. There is no impacted cerumen.     Nose: Congestion present. No rhinorrhea.     Mouth/Throat:     Mouth: Mucous membranes are moist.     Pharynx: No oropharyngeal exudate or posterior oropharyngeal erythema.     Comments: Postnasal drainage overlying pharynx and cobblestone pattern. Eyes:     General: No scleral icterus.       Right eye: No discharge.        Left eye: No discharge.     Extraocular Movements: Extraocular movements intact.     Conjunctiva/sclera: Conjunctivae normal.     Pupils: Pupils are equal, round, and reactive to light.  Cardiovascular:     Rate and Rhythm: Normal rate and regular rhythm.     Heart sounds: Normal heart sounds. No murmur heard.  No friction rub. No gallop.  Pulmonary:     Effort: Pulmonary effort is normal. No respiratory distress.     Breath sounds: Normal breath sounds. No stridor. No wheezing, rhonchi or rales.  Musculoskeletal:     Cervical back: Normal range of motion and neck supple. No rigidity. No muscular tenderness.  Neurological:     General: No focal deficit present.     Mental Status: He is alert and oriented to person, place, and time.  Psychiatric:        Mood and Affect: Mood normal.        Behavior: Behavior normal.        Thought Content: Thought content normal.    Results for orders placed or performed during the hospital encounter of 11/04/20 (from the past 24 hour(s))  POCT Rapid Strep A     Status: None   Collection Time: 11/04/20 12:54 PM  Result Value Ref Range   Streptococcus, Group A Screen (Direct) NEGATIVE  NEGATIVE    Assessment and Plan :   PDMP not reviewed this encounter.  1. Viral URI with cough   2. Sore throat     Patient refused COVID-19 test.  Strep culture pending. Suspect viral URI, viral syndrome; physical exam findings reassuring and vital signs stable for discharge. Advised supportive care, offered symptomatic relief. Counseled patient on potential for adverse effects with medications prescribed/recommended today, ER and return-to-clinic precautions discussed, patient verbalized understanding.     Wallis Bamberg, [place] 11/04/20 0160

## 2020-11-07 LAB — CULTURE, GROUP A STREP (THRC)

## 2020-11-24 ENCOUNTER — Encounter: Payer: Self-pay | Admitting: Emergency Medicine

## 2020-11-24 ENCOUNTER — Ambulatory Visit
Admission: EM | Admit: 2020-11-24 | Discharge: 2020-11-24 | Disposition: A | Discharge status: Home / Self Care | Payer: Managed Care, Other (non HMO) | Attending: Emergency Medicine | Admitting: Emergency Medicine

## 2020-11-24 ENCOUNTER — Other Ambulatory Visit: Payer: Self-pay

## 2020-11-24 DIAGNOSIS — B351 Tinea unguium: Secondary | ICD-10-CM

## 2020-11-24 DIAGNOSIS — M79675 Pain in left toe(s): Secondary | ICD-10-CM

## 2020-11-24 MED ORDER — MUPIROCIN 2 % EX OINT: 1.0000 "application " | TOPICAL_OINTMENT | Freq: Two times a day (BID) | CUTANEOUS | 0 refills | Status: DC

## 2020-11-24 NOTE — Discharge Instructions (Addendum)
Wash site daily with warm water and mild soap Bactroban ointment prescribed.  Apply to base of toenail to help prevent infection Follow up with podiatry for further evaluation and management Return or go to the ED if you have any new or worsening symptoms increased redness, swelling, pain, nausea, vomiting, fever, chills, etc..Marland Kitchen

## 2020-11-24 NOTE — ED Triage Notes (Signed)
Pt here with skin fissure on left great toe x 1 month. Not painful all the time, but when it does hurt, it is an 8/10. No redness, no swelling, no drainage. Not from injury.

## 2020-11-24 NOTE — ED Provider Notes (Signed)
  [facility]   [id number] 11/24/20 Arrival Time: 1358   CC: toenail pain  SUBJECTIVE:  Caleb Blevins is a 25 y.o. male who presents with a toenail pain x 1 month.  Currently not painful.  Denies injury.  Denies injury.  Worse with working.  Requesting work note.  States pain is 8/10.  Denies redness, swelling, drainage.    ROS: As per HPI.  All other pertinent ROS negative.     Past Medical History:  Diagnosis Date   Asthma    History reviewed. No pertinent surgical history. No Known Allergies No current facility-administered medications on file prior to encounter.   No current outpatient medications on file prior to encounter.   Social History   Socioeconomic History   Marital status: Single    Spouse name: Not on file   Number of children: Not on file   Years of education: Not on file   Highest education level: Not on file  Occupational History   Not on file  Tobacco Use   Smoking status: Every Day    Packs/day: 1.00    Types: Cigarettes   Smokeless tobacco: Never  Vaping Use   Vaping Use: Never used  Substance and Sexual Activity   Alcohol use: Yes    Comment: occasionally   Drug use: No   Sexual activity: Yes    Birth control/protection: Condom  Other Topics Concern   Not on file  Social History Narrative   Not on file   Social Determinants of Health   Financial Resource Strain: Not on file  Food Insecurity: Not on file  Transportation Needs: Not on file  Physical Activity: Not on file  Stress: Not on file  Social Connections: Not on file  Intimate Partner Violence: Not on file   Family History  Problem Relation Age of Onset   Hypertension Mother     OBJECTIVE:  Vitals:   11/24/20 1458 11/24/20 1459  BP: 131/86   Pulse: 71   Resp: 18   Temp: 97.8 F (36.6 C)   TempSrc: Oral   SpO2: 97%   Weight:  235 lb (106.6 kg)  Height:  5\' 8"  (1.727 m)     General appearance: alert; no distress CV: dorsalis pedis pulse  2+ Skin: LT great toe with thickened toenail, new growth at base of toenail, no obvious erythema, swelling, wounds, or bleeding, NTTP Psychological: alert and cooperative; normal mood and affect   ASSESSMENT & PLAN:  1. Toe pain, left   2. Toenail fungus     Meds ordered this encounter  Medications   mupirocin ointment (BACTROBAN) 2 %    Sig: Apply 1 application topically 2 (two) times daily.    Dispense:  22 g    Refill:  0    Order Specific Question:   Supervising Provider    Answer:   Eustace Moore   Wash site daily with warm water and mild soap Bactroban ointment prescribed.  Apply to base of toenail to help prevent infection Follow up with podiatry for further evaluation and management Return or go to the ED if you have any new or worsening symptoms increased redness, swelling, pain, nausea, vomiting, fever, chills, etc...    Reviewed expectations re: course of current medical issues. Questions answered. Outlined signs and symptoms indicating need for more acute intervention. Patient verbalized understanding. After Visit Summary given.           [[id number]], PA-C 11/24/20 1529

## 2020-12-10 ENCOUNTER — Ambulatory Visit
Admission: EM | Admit: 2020-12-10 | Discharge: 2020-12-10 | Disposition: A | Discharge status: Home / Self Care | Payer: Managed Care, Other (non HMO) | Attending: Emergency Medicine | Admitting: Emergency Medicine

## 2020-12-10 ENCOUNTER — Other Ambulatory Visit: Payer: Self-pay

## 2020-12-10 DIAGNOSIS — R21 Rash and other nonspecific skin eruption: Secondary | ICD-10-CM

## 2020-12-10 DIAGNOSIS — L2089 Other atopic dermatitis: Secondary | ICD-10-CM

## 2020-12-10 MED ORDER — HYDROXYZINE HCL 25 MG PO TABS: 25.0000 mg | ORAL_TABLET | Freq: Four times a day (QID) | ORAL | 0 refills | Status: DC

## 2020-12-10 MED ORDER — TRIAMCINOLONE ACETONIDE 0.1 % EX CREA: 1.0000 "application " | TOPICAL_CREAM | Freq: Two times a day (BID) | CUTANEOUS | 0 refills | Status: DC

## 2020-12-10 NOTE — ED Provider Notes (Signed)
[facility]   [id number] 12/10/20 Arrival Time: 1137  CC: rash   SUBJECTIVE:  Caleb Blevins is a 25 y.o. male who presents with a rash to arms x few days.  Works in dusty environment at work, and attributes his symptoms to that..  Localizes the rash to inside of both elbows.  Describes it as itchy.  Denies alleviating or aggravating factors.  Reports similar symptoms in the past.   Denies fever, chills, nausea, vomiting, erythema, swelling, discharge, oral lesions, SOB, chest pain.  ROS: As per HPI.  All other pertinent ROS negative.     Past Medical History:  Diagnosis Date   Asthma    History reviewed. No pertinent surgical history. No Known Allergies No current facility-administered medications on file prior to encounter.   Current Outpatient Medications on File Prior to Encounter  Medication Sig Dispense Refill   mupirocin ointment (BACTROBAN) 2 % Apply 1 application topically 2 (two) times daily. 22 g 0   Social History   Socioeconomic History   Marital status: Single    Spouse name: Not on file   Number of children: Not on file   Years of education: Not on file   Highest education level: Not on file  Occupational History   Not on file  Tobacco Use   Smoking status: Every Day    Packs/day: 1.00    Types: Cigarettes   Smokeless tobacco: Never  Vaping Use   Vaping Use: Never used  Substance and Sexual Activity   Alcohol use: Yes    Comment: occasionally   Drug use: No   Sexual activity: Yes    Birth control/protection: Condom  Other Topics Concern   Not on file  Social History Narrative   Not on file   Social Determinants of Health   Financial Resource Strain: Not on file  Food Insecurity: Not on file  Transportation Needs: Not on file  Physical Activity: Not on file  Stress: Not on file  Social Connections: Not on file  Intimate Partner Violence: Not on file   Family History  Problem Relation Age of Onset   Hypertension Mother      OBJECTIVE: Vitals:   12/10/20 1145  BP: (!) 147/83  Pulse: 78  Resp: 18  Temp: 98.2 F (36.8 C)  SpO2: 95%    General appearance: alert; no distress Head: NCAT Lungs: normal respiratory effort Skin: warm and dry; few flesh colored/ mildly erythematous papules to bilateral flexural areas of elbows, NTTP, no drainage or bleeding Psychological: alert and cooperative; normal mood and affect  ASSESSMENT & PLAN:  1. Rash and nonspecific skin eruption   2. Flexural atopic dermatitis     Meds ordered this encounter  Medications   triamcinolone cream (KENALOG) 0.1 %    Sig: Apply 1 application topically 2 (two) times daily.    Dispense:  30 g    Refill:  0    Order Specific Question:   Supervising Provider    Answer:   Eustace Moore [[id number]]   hydrOXYzine (ATARAX/VISTARIL) 25 MG tablet    Sig: Take 1 tablet (25 mg total) by mouth every 6 (six) hours.    Dispense:  12 tablet    Refill:  0    Order Specific Question:   Supervising Provider    Answer:   Eustace Moore [[id number]]    @[facility] @  Prescribed triamcinolone cream and hydroxyzine.  Hydroxyzine may make you drowsy do not take prior to driving or operating heavy  machinery Avoid hot showers/ baths Moisturize skin daily  Follow up with PCP if symptoms persists Return or go to the ER if you have any new or worsening symptoms such as fever, chills, nausea, vomiting, redness, swelling, discharge, if symptoms do not improve with medications, etc...  Reviewed expectations re: course of current medical issues. Questions answered. Outlined signs and symptoms indicating need for more acute intervention. Patient verbalized understanding. After Visit Summary given.    Rennis Harding, PA-C 12/10/20 1204

## 2020-12-10 NOTE — ED Triage Notes (Signed)
Pt presents with rash on arms and back that he had last month and then developed again 2 days ago

## 2020-12-10 NOTE — Discharge Instructions (Addendum)
Prescribed triamcinolone cream and hydroxyzine.  Hydroxyzine may make you drowsy do not take prior to driving or operating heavy machinery Avoid hot showers/ baths Moisturize skin daily  Follow up with PCP if symptoms persists Return or go to the ER if you have any new or worsening symptoms such as fever, chills, nausea, vomiting, redness, swelling, discharge, if symptoms do not improve with medications, etc..Marland Kitchen

## 2021-02-04 ENCOUNTER — Ambulatory Visit (HOSPITAL_COMMUNITY)
Admission: EM | Admit: 2021-02-04 | Discharge: 2021-02-04 | Disposition: A | Discharge status: Home / Self Care | Payer: Managed Care, Other (non HMO) | Attending: Student | Admitting: Student

## 2021-02-04 ENCOUNTER — Encounter (HOSPITAL_COMMUNITY): Payer: Self-pay | Admitting: Emergency Medicine

## 2021-02-04 ENCOUNTER — Other Ambulatory Visit: Payer: Self-pay

## 2021-02-04 DIAGNOSIS — R197 Diarrhea, unspecified: Secondary | ICD-10-CM

## 2021-02-04 MED ORDER — LOPERAMIDE HCL 2 MG PO CAPS: 2.0000 mg | ORAL_CAPSULE | Freq: Four times a day (QID) | ORAL | 0 refills | Status: DC | PRN

## 2021-02-04 NOTE — ED Provider Notes (Signed)
[facility]    CSN: [id number] Arrival date & time: 02/04/21  1713      History   Chief Complaint Chief Complaint  Patient presents with   Abdominal Pain   Diarrhea    HPI Caleb Blevins is a 25 y.o. male presenting with diarrhea and generalized crampy abdominal pain for 3 days.  Medical history noncontributory.  States that he has been trying "flour water" without improvement.  States that this is literally baking flour mixed with water.  Denies other symptoms including nausea, vomiting, fever/chills, cough.  Denies sick contacts.  HPI  Past Medical History:  Diagnosis Date   Asthma     Patient Active Problem List   Diagnosis Date Noted   Midline low back pain without sciatica 02/22/2015    History reviewed. No pertinent surgical history.     Home Medications    Prior to Admission medications   Medication Sig Start Date End Date Taking? Authorizing Provider  loperamide (IMODIUM) 2 MG capsule Take 1 capsule (2 mg total) by mouth 4 (four) times daily as needed for diarrhea or loose stools. 02/04/21  Yes Rhys Martini, PA-C  hydrOXYzine (ATARAX/VISTARIL) 25 MG tablet Take 1 tablet (25 mg total) by mouth every 6 (six) hours. 12/10/20   Wurst, [place], PA-C  mupirocin ointment (BACTROBAN) 2 % Apply 1 application topically 2 (two) times daily. 11/24/20   Wurst, [place], PA-C  triamcinolone cream (KENALOG) 0.1 % Apply 1 application topically 2 (two) times daily. 12/10/20   Rennis Harding, PA-C    Family History Family History  Problem Relation Age of Onset   Hypertension Mother     Social History Social History   Tobacco Use   Smoking status: Every Day    Packs/day: 1.00    Types: Cigarettes   Smokeless tobacco: Never  Vaping Use   Vaping Use: Never used  Substance Use Topics   Alcohol use: Yes    Comment: occasionally   Drug use: No     Allergies   Patient has no known allergies.   Review of Systems Review of Systems   Constitutional:  Negative for appetite change, chills, diaphoresis, fever and unexpected weight change.  HENT:  Negative for congestion, ear pain, sinus pressure, sinus pain, sneezing, sore throat and trouble swallowing.   Respiratory:  Negative for cough, chest tightness and shortness of breath.   Cardiovascular:  Negative for chest pain.  Gastrointestinal:  Positive for abdominal pain and diarrhea. Negative for abdominal distention, anal bleeding, blood in stool, constipation, nausea, rectal pain and vomiting.  Genitourinary:  Negative for dysuria, flank pain, frequency and urgency.  Musculoskeletal:  Negative for back pain and myalgias.  Neurological:  Negative for dizziness, light-headedness and headaches.  All other systems reviewed and are negative.   Physical Exam Triage Vital Signs ED Triage Vitals  Enc Vitals Group     BP 02/04/21 1729 (!) 143/91     Pulse Rate 02/04/21 1729 71     Resp 02/04/21 1729 16     Temp 02/04/21 1729 98.8 F (37.1 C)     Temp Source 02/04/21 1729 Oral     SpO2 02/04/21 1729 98 %     Weight --      Height --      Head Circumference --      Peak Flow --      Pain Score 02/04/21 1728 6     Pain Loc --      Pain Edu? --  Excl. in GC? --    No data found.  Updated Vital Signs BP (!) 143/91 (BP Location: Left Arm)   Pulse 71   Temp 98.8 F (37.1 C) (Oral)   Resp 16   SpO2 98%   Visual Acuity Right Eye Distance:   Left Eye Distance:   Bilateral Distance:    Right Eye Near:   Left Eye Near:    Bilateral Near:     Physical Exam Vitals reviewed.  Constitutional:      General: He is not in acute distress.    Appearance: Normal appearance. He is not ill-appearing.  HENT:     Head: Normocephalic and atraumatic.     Mouth/Throat:     Mouth: Mucous membranes are moist.     Comments: Moist mucous membranes Eyes:     Extraocular Movements: Extraocular movements intact.     Pupils: Pupils are equal, round, and reactive to light.   Cardiovascular:     Rate and Rhythm: Normal rate and regular rhythm.     Heart sounds: Normal heart sounds.  Pulmonary:     Effort: Pulmonary effort is normal.     Breath sounds: Normal breath sounds. No wheezing, rhonchi or rales.  Abdominal:     General: Bowel sounds are increased. There is no distension.     Palpations: Abdomen is soft. There is no mass.     Tenderness: There is abdominal tenderness. There is no right CVA tenderness, left CVA tenderness, guarding or rebound. Negative signs include Murphy's sign, Rovsing's sign and McBurney's sign.     Comments: Generalized crampy tenderness without point tenderness BS increased throughout  Skin:    General: Skin is warm.     Capillary Refill: Capillary refill takes less than 2 seconds.     Comments: Good skin turgor  Neurological:     General: No focal deficit present.     Mental Status: He is alert and oriented to person, place, and time.  Psychiatric:        Mood and Affect: Mood normal.        Behavior: Behavior normal.     UC Treatments / Results  Labs (all labs ordered are listed, but only abnormal results are displayed) Labs Reviewed - No data to display  EKG   Radiology No results found.  Procedures Procedures (including critical care time)  Medications Ordered in UC Medications - No data to display  Initial Impression / Assessment and Plan / UC Course  I have reviewed the triage vital signs and the nursing notes.  Pertinent labs & imaging results that were available during my care of the patient were reviewed by me and considered in my medical decision making (see chart for details).     This patient is a very pleasant 25 y.o. year old male presenting with diarrhea. Appears well hydrated. Afebrile, nontachy. Imodium, good hydration. ED return precautions discussed. Patient verbalizes understanding and agreement. .   Final Clinical Impressions(s) / UC Diagnoses   Final diagnoses:  Diarrhea,  unspecified type     Discharge Instructions      -Take the Imodium (loperamide) up to 4 times daily for diarrhea. -Drink plenty of fluids    ED Prescriptions     Medication Sig Dispense Auth. Provider   loperamide (IMODIUM) 2 MG capsule Take 1 capsule (2 mg total) by mouth 4 (four) times daily as needed for diarrhea or loose stools. 12 capsule Rhys Martini, PA-C      PDMP not  reviewed this encounter.   Rhys Martini, PA-C 02/04/21 1752

## 2021-02-04 NOTE — ED Triage Notes (Signed)
Pt c/o abd pains with diarrhea for 3 days. Denies n/v or urination.

## 2021-02-04 NOTE — Discharge Instructions (Addendum)
-  Take the Imodium (loperamide) up to 4 times daily for diarrhea. -Drink plenty of fluids

## 2021-03-16 ENCOUNTER — Ambulatory Visit (HOSPITAL_COMMUNITY)
Admission: EM | Admit: 2021-03-16 | Discharge: 2021-03-16 | Disposition: A | Discharge status: Home / Self Care | Payer: Managed Care, Other (non HMO) | Attending: Emergency Medicine | Admitting: Emergency Medicine

## 2021-03-16 ENCOUNTER — Other Ambulatory Visit: Payer: Self-pay

## 2021-03-16 ENCOUNTER — Encounter (HOSPITAL_COMMUNITY): Payer: Self-pay | Admitting: Emergency Medicine

## 2021-03-16 DIAGNOSIS — J4521 Mild intermittent asthma with (acute) exacerbation: Secondary | ICD-10-CM | POA: Diagnosis not present

## 2021-03-16 MED ORDER — PREDNISONE 20 MG PO TABS: 40.0000 mg | ORAL_TABLET | Freq: Every day | ORAL | 0 refills | Status: AC

## 2021-03-16 MED ORDER — ALBUTEROL SULFATE HFA 108 (90 BASE) MCG/ACT IN AERS: 2.0000 | INHALATION_SPRAY | RESPIRATORY_TRACT | 0 refills | Status: DC | PRN

## 2021-03-16 NOTE — ED Provider Notes (Signed)
[facility]    CSN: [id number] Arrival date & time: 03/16/21  1515      History   Chief Complaint Chief Complaint  Patient presents with   Asthma    HPI Caleb Blevins is a 25 y.o. male.   Patient here for evaluation of shortness of breath and coughing that began while he was at work today.  Patient does have a history of asthma.  Reports using one of his mother's inhaler which did help with symptom management.  Denies any recent sick contacts.  Denies any fevers at home.  Does not take any daily asthma control medications.  Denies any trauma, injury, or other precipitating event.  Denies any specific alleviating or aggravating factors.  Denies any fevers, chest pain, N/V/D, numbness, tingling, weakness, abdominal pain, or headaches.    The history is provided by the patient.  Asthma Associated symptoms include shortness of breath.   Past Medical History:  Diagnosis Date   Asthma     Patient Active Problem List   Diagnosis Date Noted   Midline low back pain without sciatica 02/22/2015    History reviewed. No pertinent surgical history.     Home Medications    Prior to Admission medications   Medication Sig Start Date End Date Taking? Authorizing Provider  albuterol (VENTOLIN HFA) 108 (90 Base) MCG/ACT inhaler Inhale 2 puffs into the lungs every 4 (four) hours as needed for wheezing or shortness of breath. 03/16/21  Yes Ivette Loyal, NP  predniSONE (DELTASONE) 20 MG tablet Take 2 tablets (40 mg total) by mouth daily for 5 days. 03/16/21 03/21/21 Yes Ivette Loyal, NP  hydrOXYzine (ATARAX/VISTARIL) 25 MG tablet Take 1 tablet (25 mg total) by mouth every 6 (six) hours. 12/10/20   Wurst, [place], PA-C  loperamide (IMODIUM) 2 MG capsule Take 1 capsule (2 mg total) by mouth 4 (four) times daily as needed for diarrhea or loose stools. 02/04/21   Rhys Martini, PA-C  mupirocin ointment (BACTROBAN) 2 % Apply 1 application topically 2 (two) times daily.  11/24/20   Wurst, [place], PA-C  triamcinolone cream (KENALOG) 0.1 % Apply 1 application topically 2 (two) times daily. 12/10/20   Rennis Harding, PA-C    Family History Family History  Problem Relation Age of Onset   Hypertension Mother     Social History Social History   Tobacco Use   Smoking status: Every Day    Packs/day: 1.00    Types: Cigarettes   Smokeless tobacco: Never  Vaping Use   Vaping Use: Never used  Substance Use Topics   Alcohol use: Yes    Comment: occasionally   Drug use: No     Allergies   Patient has no known allergies.   Review of Systems Review of Systems  Respiratory:  Positive for cough and shortness of breath.   All other systems reviewed and are negative.   Physical Exam Triage Vital Signs ED Triage Vitals  Enc Vitals Group     BP 03/16/21 1657 (!) 123/107     Pulse Rate 03/16/21 1657 66     Resp 03/16/21 1657 18     Temp 03/16/21 1657 98.1 F (36.7 C)     Temp src --      SpO2 03/16/21 1657 100 %     Weight --      Height --      Head Circumference --      Peak Flow --      Pain  Score 03/16/21 1655 0     Pain Loc --      Pain Edu? --      Excl. in GC? --    No data found.  Updated Vital Signs BP (!) 123/107   Pulse 66   Temp 98.1 F (36.7 C)   Resp 18   SpO2 100%   Visual Acuity Right Eye Distance:   Left Eye Distance:   Bilateral Distance:    Right Eye Near:   Left Eye Near:    Bilateral Near:     Physical Exam Vitals and nursing note reviewed.  Constitutional:      General: He is not in acute distress.    Appearance: Normal appearance. He is not ill-appearing, toxic-appearing or diaphoretic.  HENT:     Head: Normocephalic and atraumatic.     Nose: No congestion.  Eyes:     Conjunctiva/sclera: Conjunctivae normal.  Cardiovascular:     Rate and Rhythm: Normal rate.     Pulses: Normal pulses.     Heart sounds: Normal heart sounds.  Pulmonary:     Effort: Pulmonary effort is normal.     Breath  sounds: Wheezing present.  Abdominal:     General: Abdomen is flat.  Musculoskeletal:        General: Normal range of motion.     Cervical back: Normal range of motion.  Skin:    General: Skin is warm and dry.  Neurological:     General: No focal deficit present.     Mental Status: He is alert and oriented to person, place, and time.  Psychiatric:        Mood and Affect: Mood normal.     UC Treatments / Results  Labs (all labs ordered are listed, but only abnormal results are displayed) Labs Reviewed - No data to display  EKG   Radiology No results found.  Procedures Procedures (including critical care time)  Medications Ordered in UC Medications - No data to display  Initial Impression / Assessment and Plan / UC Course  I have reviewed the triage vital signs and the nursing notes.  Pertinent labs & imaging results that were available during my care of the patient were reviewed by me and considered in my medical decision making (see chart for details).    Assessment negative for red flags or concerns.  This is likely an acute asthma exacerbation.  We will treat with prednisone daily for the next 5 days.  May also use albuterol inhaler as needed for shortness of breath and wheezing.  Recommend a daily antihistamine such as Claritin or Zyrtec.  Encourage fluids and rest.  Follow-up with primary care for reevaluation. Final Clinical Impressions(s) / UC Diagnoses   Final diagnoses:  Mild intermittent asthma with exacerbation     Discharge Instructions      Take the prednisone 2 pills twice a day for the next 5 days.  Take this in the morning. You may use the albuterol inhaler every 4 hours as needed for shortness of breath and wheezing.  You can take a daily antihistamine such as Claritin or Zyrtec.  Return or go to the Emergency Department if symptoms worsen or do not improve in the next few days.      ED Prescriptions     Medication Sig Dispense Auth.  Provider   predniSONE (DELTASONE) 20 MG tablet Take 2 tablets (40 mg total) by mouth daily for 5 days. 10 tablet Ivette LoyalSmith, Gao Mitnick R, NP  albuterol (VENTOLIN HFA) 108 (90 Base) MCG/ACT inhaler Inhale 2 puffs into the lungs every 4 (four) hours as needed for wheezing or shortness of breath. 18 g Pearson Forster, NP      PDMP not reviewed this encounter.   Pearson Forster, NP 03/16/21 1715

## 2021-03-16 NOTE — ED Triage Notes (Signed)
Pt is present today with SOB. Pt states that he has a hx of asthma. Pt states that he noticed the sob today while at work

## 2021-03-16 NOTE — Discharge Instructions (Signed)
Take the prednisone 2 pills twice a day for the next 5 days.  Take this in the morning. You may use the albuterol inhaler every 4 hours as needed for shortness of breath and wheezing.  You can take a daily antihistamine such as Claritin or Zyrtec.  Return or go to the Emergency Department if symptoms worsen or do not improve in the next few days.

## 2021-03-30 ENCOUNTER — Telehealth: Payer: Self-pay

## 2021-03-30 NOTE — Telephone Encounter (Signed)
Called patient to schedule Dermatology appointment LVM if patient calls back please assist in scheduling.  Appointment Notes: Ref by [facility] (Atopic Dermatitis).  Thanks!

## 2021-04-24 ENCOUNTER — Other Ambulatory Visit: Payer: Self-pay

## 2021-04-24 ENCOUNTER — Encounter (HOSPITAL_COMMUNITY): Payer: Self-pay

## 2021-04-24 ENCOUNTER — Ambulatory Visit (HOSPITAL_COMMUNITY)
Admission: EM | Admit: 2021-04-24 | Discharge: 2021-04-24 | Disposition: A | Discharge status: Home / Self Care | Payer: Managed Care, Other (non HMO) | Attending: Family Medicine | Admitting: Family Medicine

## 2021-04-24 DIAGNOSIS — B9689 Other specified bacterial agents as the cause of diseases classified elsewhere: Secondary | ICD-10-CM

## 2021-04-24 DIAGNOSIS — Z202 Contact with and (suspected) exposure to infections with a predominantly sexual mode of transmission: Secondary | ICD-10-CM

## 2021-04-24 MED ORDER — METRONIDAZOLE 500 MG PO TABS: 500.0000 mg | ORAL_TABLET | Freq: Two times a day (BID) | ORAL | 0 refills | Status: DC

## 2021-04-24 NOTE — ED Triage Notes (Signed)
Pt presents today for STI testing. He reports girlfriend gets BV all the time.

## 2021-04-24 NOTE — Discharge Instructions (Addendum)
I have prescribed a medication for you to take twice/day x 7 days for possible BV infection.  Please complete the medication.  You should avoid drinking alcohol while taking this medication.

## 2021-04-24 NOTE — ED Provider Notes (Signed)
[place]    CSN: [id number] Arrival date & time: 04/24/21  1040      History   Chief Complaint No chief complaint on file.   HPI Caleb Blevins is a 25 y.o. male.   Patient is here to be treated for BV.  His girlfriend has been treated for this more than once, and was told the male may need treatment as well.   He is not having any symptoms  No penile rash/discharge.  1 partner, no condoms.  No risk for stds that he is aware;   he declines testing for STDs, declines lab work at this time.   Past Medical History:  Diagnosis Date   Asthma     Patient Active Problem List   Diagnosis Date Noted   Midline low back pain without sciatica 02/22/2015    No past surgical history on file.     Home Medications    Prior to Admission medications   Medication Sig Start Date End Date Taking? Authorizing Provider  albuterol (VENTOLIN HFA) 108 (90 Base) MCG/ACT inhaler Inhale 2 puffs into the lungs every 4 (four) hours as needed for wheezing or shortness of breath. 03/16/21   Pearson Forster, NP  hydrOXYzine (ATARAX/VISTARIL) 25 MG tablet Take 1 tablet (25 mg total) by mouth every 6 (six) hours. 12/10/20   Wurst, [place], PA-C  loperamide (IMODIUM) 2 MG capsule Take 1 capsule (2 mg total) by mouth 4 (four) times daily as needed for diarrhea or loose stools. 02/04/21   Hazel Sams, PA-C  mupirocin ointment (BACTROBAN) 2 % Apply 1 application topically 2 (two) times daily. 11/24/20   Wurst, [place], PA-C  triamcinolone cream (KENALOG) 0.1 % Apply 1 application topically 2 (two) times daily. 12/10/20   Lestine Box, PA-C    Family History Family History  Problem Relation Age of Onset   Hypertension Mother     Social History Social History   Tobacco Use   Smoking status: Every Day    Packs/day: 1.00    Types: Cigarettes   Smokeless tobacco: Never  Vaping Use   Vaping Use: Never used  Substance Use Topics   Alcohol use: Yes    Comment: occasionally    Drug use: No     Allergies   Patient has no known allergies.   Review of Systems Review of Systems  Constitutional: Negative.   HENT: Negative.    Respiratory: Negative.    Cardiovascular: Negative.   Gastrointestinal: Negative.   Skin: Negative.     Physical Exam Triage Vital Signs ED Triage Vitals  Enc Vitals Group     BP      Pulse      Resp      Temp      Temp src      SpO2      Weight      Height      Head Circumference      Peak Flow      Pain Score      Pain Loc      Pain Edu?      Excl. in [place]?    No data found.  Updated Vital Signs There were no vitals taken for this visit.    Physical Exam Constitutional:      Appearance: Normal appearance.  HENT:     Head: Normocephalic and atraumatic.  Cardiovascular:     Rate and Rhythm: Normal rate and regular rhythm.  Pulmonary:  Effort: Pulmonary effort is normal.     Breath sounds: Normal breath sounds.  Genitourinary:    Penis: Normal.      Testes: Normal.  Skin:    General: Skin is warm and dry.  Neurological:     Mental Status: He is alert.     UC Treatments / Results  Labs (all labs ordered are listed, but only abnormal results are displayed) Labs Reviewed - No data to display  EKG   Radiology No results found.  Procedures Procedures (including critical care time)  Medications Ordered in UC Medications - No data to display  Initial Impression / Assessment and Plan / UC Course  I have reviewed the triage vital signs and the nursing notes.  Pertinent labs & imaging results that were available during my care of the patient were reviewed by me and considered in my medical decision making (see chart for details).  Patient presents for treatment of BV as his partner has had multiple episodes of this and was asked to get treatment.  He declines any symptoms;  He declines any further STI testing today.   Flagyl sent to the pharmacy today;  advised to take all medication and avoid  ETOH while taking this medication.    Final Clinical Impressions(s) / UC Diagnoses   Final diagnoses:  BV (bacterial vaginosis)     Discharge Instructions      I have prescribed a medication for you to take twice/day x 7 days for possible BV infection.  Please complete the medication.  You should avoid drinking alcohol while taking this medication.     ED Prescriptions     Medication Sig Dispense Auth. Provider   metroNIDAZOLE (FLAGYL) 500 MG tablet Take 1 tablet (500 mg total) by mouth 2 (two) times daily. 14 tablet Jannifer Franklin, MD      PDMP not reviewed this encounter.   Jannifer Franklin, MD 04/24/21 1315

## 2021-06-29 ENCOUNTER — Ambulatory Visit (HOSPITAL_COMMUNITY)
Admission: EM | Admit: 2021-06-29 | Discharge: 2021-06-29 | Disposition: A | Discharge status: Home / Self Care | Payer: 59

## 2021-06-29 DIAGNOSIS — F4322 Adjustment disorder with anxiety: Secondary | ICD-10-CM

## 2021-06-29 NOTE — Discharge Instructions (Addendum)

## 2021-06-29 NOTE — Progress Notes (Signed)
Caleb Blevins is routine.  Depressed, stressed out, lots of psycho-social stressors, financial issues, about lost his home, not getting enough hours at work.  States that he broke down at work and just started crying.  Needs to see someone for his depression. No SI/HI/Psychosis.  Occasional alcohol use

## 2021-06-29 NOTE — ED Provider Notes (Signed)
[facility]al Health Urgent Care Medical Screening Exam  Patient Name: Caleb Blevins MRN: [id number] Date of Evaluation: 06/29/21 Chief Complaint:   Diagnosis:  Final diagnoses:  Adjustment disorder with anxious mood    History of Present illness: Caleb Blevins is a 26 y.o. male. Patient presents voluntarily to [facility] [address] behavioral health for walk-in assessment.    Patient states "I have been going through a lot of stress for the past year and the other day at work I broke down in tears so I thought I would come and talk to somebody."  Patient's hours have recently been cut back at his job and he is behind on bills.  [name] shares that recent stressors include financial concerns as well as he has relationship difficulties with male friend.  He reports male friend often assumes he is doing something inappropriate and he is not.  Patient is assessed face-to-face by nurse practitioner.  He is seated in assessment area, no acute distress.  He is alert and oriented, pleasant and cooperative during assessment.   He presents with euthymic mood, congruent affect. He denies suicidal and homicidal ideations.  He denies history of suicide attempts, denies history of self-harm.  He contracts verbally for safety with this Probation officer.  Patient is not linked with outpatient psychiatry.  He denies any mental health history.  No family mental health history reported.  No history of inpatient psychiatric hospitalizations.  He has normal speech and behavior.  He denies both auditory and visual hallucinations.  Patient is able to converse coherently with goal-directed thoughts and no distractibility or preoccupation.  Objectively there is no evidence of psychosis/mania or delusional thinking.  Patient resides in [place] with his mother his brother and his aunt.  He denies access to weapons.  He is employed in [employer].  Patient endorses average sleep and appetite.  He  endorses alcohol use approximately 2 times per week, typically consumes between 3 and 6 drinks during each episode of alcohol use.  He denies substance use aside from alcohol use.  Patient offered support and encouragement.   Psychiatric Specialty Exam  Presentation  General Appearance:Casual; Appropriate for Environment  Eye Contact:Good  Speech:Clear and Coherent; Normal Rate  Speech Volume:Normal  Handedness:Right   Mood and Affect  Mood:Euthymic  Affect:Appropriate; Congruent   Thought Process  Thought Processes:Coherent; Goal Directed; Linear  Descriptions of Associations:Intact  Orientation:Full (Time, Place and Person)  Thought Content:Logical; WDL    Hallucinations:None  Ideas of Reference:None  Suicidal Thoughts:No  Homicidal Thoughts:No   Sensorium  Memory:Immediate Good; Recent Good; Remote Good  Judgment:Good  Insight:Good   Executive Functions  Concentration:Good  Attention Span:Good  [place]  Language:Good   Psychomotor Activity  Psychomotor Activity:Normal   Assets  Assets:Communication Skills; Desire for Improvement; Financial Resources/Insurance; Housing; Intimacy; Leisure Time; Physical Health; Resilience; Social Support   Sleep  Sleep:Fair  Number of hours: No data recorded  No data recorded  Physical Exam: Physical Exam Vitals and nursing note reviewed.  Constitutional:      Appearance: Normal appearance. He is well-developed and normal weight.  HENT:     Head: Normocephalic and atraumatic.     Nose: Nose normal.  Cardiovascular:     Rate and Rhythm: Normal rate.  Pulmonary:     Effort: Pulmonary effort is normal.  Musculoskeletal:        General: Normal range of motion.     Cervical back: Normal range of motion.  Skin:  General: Skin is warm and dry.  Neurological:     Mental Status: He is alert and oriented to person, place, and time.  Psychiatric:        Attention and  Perception: Attention and perception normal.        Mood and Affect: Mood and affect normal.        Speech: Speech normal.        Behavior: Behavior normal. Behavior is cooperative.        Thought Content: Thought content normal.        Cognition and Memory: Cognition and memory normal.        Judgment: Judgment normal.   Review of Systems  Constitutional: Negative.   HENT: Negative.    Eyes: Negative.   Respiratory: Negative.    Cardiovascular: Negative.   Gastrointestinal: Negative.   Genitourinary: Negative.   Musculoskeletal: Negative.   Skin: Negative.   Neurological: Negative.   Endo/Heme/Allergies: Negative.   Psychiatric/Behavioral: Negative.    Blood pressure (!) 145/99, pulse 72, temperature 98.6 F (37 C), temperature source Oral, resp. rate 18, SpO2 98 %. There is no height or weight on file to calculate BMI.  Musculoskeletal: Strength & Muscle Tone: within normal limits Gait & Station: normal Patient leans: N/A   [facility] Discharge Disposition for Follow up and Recommendations: Based on my evaluation the patient does not appear to have an emergency medical condition and can be discharged with resources and follow up care in outpatient services for Medication Management and Individual Therapy Patient reviewed with Dr Serafina Mitchell.  Follow up with outpatient psychiatry, resources provided.  Lucky Rathke, FNP 06/29/2021, 7:03 PM

## 2021-08-03 ENCOUNTER — Ambulatory Visit (HOSPITAL_COMMUNITY)
Admission: EM | Admit: 2021-08-03 | Discharge: 2021-08-03 | Disposition: A | Discharge status: Home / Self Care | Payer: Managed Care, Other (non HMO) | Attending: Nurse Practitioner | Admitting: Nurse Practitioner

## 2021-08-03 ENCOUNTER — Encounter (HOSPITAL_COMMUNITY): Payer: Self-pay | Admitting: Emergency Medicine

## 2021-08-03 DIAGNOSIS — R051 Acute cough: Secondary | ICD-10-CM

## 2021-08-03 DIAGNOSIS — J45901 Unspecified asthma with (acute) exacerbation: Secondary | ICD-10-CM | POA: Diagnosis not present

## 2021-08-03 DIAGNOSIS — R0602 Shortness of breath: Secondary | ICD-10-CM

## 2021-08-03 MED ORDER — ALBUTEROL SULFATE (2.5 MG/3ML) 0.083% IN NEBU
INHALATION_SOLUTION | RESPIRATORY_TRACT | Status: AC
  Filled 2021-08-03: qty 3

## 2021-08-03 MED ORDER — ALBUTEROL SULFATE (2.5 MG/3ML) 0.083% IN NEBU
2.5000 mg | INHALATION_SOLUTION | RESPIRATORY_TRACT | Status: AC
  Administered 2021-08-03: 2.5 mg via RESPIRATORY_TRACT

## 2021-08-03 MED ORDER — PREDNISONE 20 MG PO TABS: 40.0000 mg | ORAL_TABLET | Freq: Every day | ORAL | 0 refills | Status: AC

## 2021-08-03 MED ORDER — ALBUTEROL SULFATE HFA 108 (90 BASE) MCG/ACT IN AERS: 2.0000 | INHALATION_SPRAY | Freq: Four times a day (QID) | RESPIRATORY_TRACT | 1 refills | Status: DC | PRN

## 2021-08-03 MED ORDER — MONTELUKAST SODIUM 10 MG PO TABS: 10.0000 mg | ORAL_TABLET | Freq: Every day | ORAL | 1 refills | Status: DC

## 2021-08-03 NOTE — ED Triage Notes (Signed)
Pt is present today with SOB, wheezing, and cough. Pt sx started one week  ?

## 2021-08-03 NOTE — ED Provider Notes (Signed)
?[facility] ? ? ? ?CSN: [id number] ?Arrival date & time: 08/03/21  1404 ? ? ?  ? ?History   ?Chief Complaint ?Chief Complaint  ?Patient presents with  ? Cough  ? Asthma  ? ? ?HPI ?Caleb Blevins is a 26 y.o. male.  ? ?The patient is a 26 year old male who presents for an asthma exacerbation.  Patient states over the past week he has had some shortness of breath, wheezing, and chest tightness.  States that he has also had a productive cough.  Cough is productive of clear, white sputum.  He denies fever, chills, runny nose, sore throat, abdominal pain.  He states he has been out of his inhaler for the past several weeks.  States he has been trying home remedies such as "sucking on ice" to help with his symptoms.  He states that he does become short of breath at rest, and when lying down.  His last asthma exacerbation was in November, he was seen in this clinic for the same. ? ? ?Cough ?Cough characteristics:  Productive ?Sputum characteristics:  White ?Timing:  Intermittent ?Progression:  Waxing and waning ?Context: weather changes and with activity   ?Context: not upper respiratory infection   ?Relieved by: home remedies. ?Worsened by:  Lying down, environmental changes and deep breathing ?Associated symptoms: shortness of breath and wheezing   ?Associated symptoms: no chest pain, no ear pain, no fever, no headaches, no myalgias, no rhinorrhea, no sinus congestion and no sore throat   ?Risk factors: no recent infection   ?Asthma ?Associated symptoms include shortness of breath. Pertinent negatives include no chest pain and no headaches.  ? ?Past Medical History:  ?Diagnosis Date  ? Asthma   ? ? ?Patient Active Problem List  ? Diagnosis Date Noted  ? Midline low back pain without sciatica 02/22/2015  ? ? ?History reviewed. No pertinent surgical history. ? ? ? ? ?Home Medications   ? ?Prior to Admission medications   ?Medication Sig Start Date End Date Taking? Authorizing Provider  ?albuterol (VENTOLIN  HFA) 108 (90 Base) MCG/ACT inhaler Inhale 2 puffs into the lungs every 4 (four) hours as needed for wheezing or shortness of breath. 03/16/21   Ivette Loyal, NP  ?hydrOXYzine (ATARAX/VISTARIL) 25 MG tablet Take 1 tablet (25 mg total) by mouth every 6 (six) hours. 12/10/20   Wurst, [place], PA-C  ?loperamide (IMODIUM) 2 MG capsule Take 1 capsule (2 mg total) by mouth 4 (four) times daily as needed for diarrhea or loose stools. 02/04/21   Rhys Martini, PA-C  ?metroNIDAZOLE (FLAGYL) 500 MG tablet Take 1 tablet (500 mg total) by mouth 2 (two) times daily. 04/24/21   Piontek, Denny Peon, MD  ?mupirocin ointment (BACTROBAN) 2 % Apply 1 application topically 2 (two) times daily. 11/24/20   Wurst, [place], PA-C  ?triamcinolone cream (KENALOG) 0.1 % Apply 1 application topically 2 (two) times daily. 12/10/20   Rennis Harding, PA-C  ? ? ?Family History ?Family History  ?Problem Relation Age of Onset  ? Hypertension Mother   ? ? ?Social History ?Social History  ? ?Tobacco Use  ? Smoking status: Every Day  ?  Packs/day: 1.00  ?  Types: Cigarettes  ? Smokeless tobacco: Never  ?Vaping Use  ? Vaping Use: Never used  ?Substance Use Topics  ? Alcohol use: Yes  ?  Comment: occasionally  ? Drug use: No  ? ? ? ?Allergies   ?Patient has no known allergies. ? ? ?Review of Systems ?Review of Systems  ?  Constitutional: Negative.  Negative for fever.  ?HENT:  Negative for ear pain, rhinorrhea and sore throat.   ?Respiratory:  Positive for cough, shortness of breath and wheezing.   ?Cardiovascular:  Negative for chest pain.  ?Gastrointestinal: Negative.   ?Musculoskeletal:  Negative for myalgias.  ?Skin: Negative.   ?Neurological:  Negative for headaches.  ?Psychiatric/Behavioral: Negative.    ? ? ?Physical Exam ?Triage Vital Signs ?ED Triage Vitals  ?Enc Vitals Group  ?   BP 08/03/21 1431 (!) 148/101  ?   Pulse Rate 08/03/21 1431 99  ?   Resp 08/03/21 1431 18  ?   Temp 08/03/21 1431 98.4 ?F (36.9 ?C)  ?   Temp src --   ?   SpO2 08/03/21 1431  99 %  ?   Weight --   ?   Height --   ?   Head Circumference --   ?   Peak Flow --   ?   Pain Score 08/03/21 1432 0  ?   Pain Loc --   ?   Pain Edu? --   ?   Excl. in [place]? --   ? ?No data found. ? ?Updated Vital Signs ?BP (!) 148/101   Pulse 99   Temp 98.4 ?F (36.9 ?C)   Resp 18   SpO2 99%  ? ?Visual Acuity ?Right Eye Distance:   ?Left Eye Distance:   ?Bilateral Distance:   ? ?Right Eye Near:   ?Left Eye Near:    ?Bilateral Near:    ? ?Physical Exam ?Vitals reviewed.  ?Constitutional:   ?   General: He is not in acute distress. ?   Appearance: Normal appearance.  ?HENT:  ?   Head: Normocephalic and atraumatic.  ?   Right Ear: Tympanic membrane, ear canal and external ear normal.  ?   Left Ear: Tympanic membrane, ear canal and external ear normal.  ?   Nose:  ?   Right Turbinates: Enlarged and swollen.  ?   Left Turbinates: Enlarged and swollen.  ?   Mouth/Throat:  ?   Mouth: Mucous membranes are moist.  ?Eyes:  ?   Extraocular Movements: Extraocular movements intact.  ?   Conjunctiva/sclera: Conjunctivae normal.  ?   Pupils: Pupils are equal, round, and reactive to light.  ?Cardiovascular:  ?   Rate and Rhythm: Normal rate and regular rhythm.  ?   Pulses: Normal pulses.  ?   Heart sounds: Normal heart sounds.  ?Pulmonary:  ?   Breath sounds: Examination of the right-lower field reveals decreased breath sounds. Examination of the left-lower field reveals decreased breath sounds. Decreased breath sounds present. No wheezing.  ?Abdominal:  ?   General: Bowel sounds are normal.  ?   Palpations: Abdomen is soft.  ?Musculoskeletal:  ?   Cervical back: Normal range of motion.  ?Neurological:  ?   Mental Status: He is alert.  ? ? ? ?UC Treatments / Results  ?Labs ?(all labs ordered are listed, but only abnormal results are displayed) ?Labs Reviewed - No data to display ? ?EKG ? ? ?Radiology ?No results found. ? ?Procedures ?Procedures (including critical care time) ? ?Medications Ordered in UC ?Medications  ?albuterol  (PROVENTIL) (2.5 MG/3ML) 0.083% nebulizer solution 2.5 mg (has no administration in time range)  ? ? ?Initial Impression / Assessment and Plan / UC Course  ?I have reviewed the triage vital signs and the nursing notes. ? ?Pertinent labs & imaging results that were available during my care of  the patient were reviewed by me and considered in my medical decision making (see chart for details). ? ?The patient is a 26 year old male who presents for asthma symptoms.  Symptoms started approximately 1 week ago.  He continues to have intermittent shortness of breath, wheezing, and chest tightness.  He reports that he has been out of his inhaler for several weeks and has been using home remedies to help with his symptoms.  His exam shows some decreased air movement in the posterior lower lobes.  He is speaking in complete sentences, O2 sat is 99% on room air.  He does not present in any distress at this time.  His blood pressure is elevated, but other vitals are stable.  Will prescribe the patient prednisone for the next 5 days, Singulair to help with his allergy/asthma symptoms, and I will refill his albuterol inhaler.  Patient encouraged to follow-up with his primary care for continued monitoring and surveillance of his asthma symptoms.  Follow-up if symptoms worsen or do not improve. ?Final Clinical Impressions(s) / UC Diagnoses  ? ?Final diagnoses:  ?None  ? ?Discharge Instructions   ?None ?  ? ?ED Prescriptions   ?None ?  ? ?PDMP not reviewed this encounter. ?  ?Abran Cantor, NP ?08/03/21 1509 ? ?

## 2021-08-03 NOTE — Discharge Instructions (Addendum)
Take medication as prescribed. ?Recommend following up with your primary care physician for continued monitoring of your asthma symptoms and to monitor your elevated blood pressure. ?Try to become aware of your triggers for your asthma symptoms. ?Follow-up if you continue to experience shortness of breath, difficulty breathing, or other concerns. ?

## 2021-08-04 ENCOUNTER — Other Ambulatory Visit: Payer: Self-pay | Admitting: Nurse Practitioner

## 2021-08-09 ENCOUNTER — Emergency Department (HOSPITAL_COMMUNITY): Payer: Managed Care, Other (non HMO)

## 2021-08-09 ENCOUNTER — Other Ambulatory Visit: Payer: Self-pay

## 2021-08-09 ENCOUNTER — Emergency Department (HOSPITAL_COMMUNITY)
Admission: EM | Admit: 2021-08-09 | Discharge: 2021-08-09 | Disposition: A | Discharge status: Home / Self Care | Payer: Managed Care, Other (non HMO) | Attending: Emergency Medicine | Admitting: Emergency Medicine

## 2021-08-09 DIAGNOSIS — E860 Dehydration: Secondary | ICD-10-CM | POA: Diagnosis not present

## 2021-08-09 DIAGNOSIS — R1084 Generalized abdominal pain: Secondary | ICD-10-CM | POA: Diagnosis not present

## 2021-08-09 DIAGNOSIS — R197 Diarrhea, unspecified: Secondary | ICD-10-CM | POA: Diagnosis not present

## 2021-08-09 DIAGNOSIS — Z79899 Other long term (current) drug therapy: Secondary | ICD-10-CM | POA: Diagnosis not present

## 2021-08-09 DIAGNOSIS — K920 Hematemesis: Secondary | ICD-10-CM | POA: Insufficient documentation

## 2021-08-09 DIAGNOSIS — R112 Nausea with vomiting, unspecified: Secondary | ICD-10-CM | POA: Diagnosis present

## 2021-08-09 LAB — COMPREHENSIVE METABOLIC PANEL
ALT: 38 U/L (ref 0–44)
AST: 29 U/L (ref 15–41)
Albumin: 4.6 g/dL (ref 3.5–5.0)
Alkaline Phosphatase: 72 U/L (ref 38–126)
Anion gap: 11 (ref 5–15)
BUN: 10 mg/dL (ref 6–20)
CO2: 23 mmol/L (ref 22–32)
Calcium: 10 mg/dL (ref 8.9–10.3)
Chloride: 104 mmol/L (ref 98–111)
Creatinine, Ser: 1.91 mg/dL — ABNORMAL HIGH (ref 0.61–1.24)
GFR, Estimated: 48 mL/min — ABNORMAL LOW (ref >60)
Glucose, Bld: 128 mg/dL — ABNORMAL HIGH (ref 70–99)
Potassium: 4.4 mmol/L (ref 3.5–5.1)
Sodium: 138 mmol/L (ref 135–145)
Total Bilirubin: 0.9 mg/dL (ref 0.3–1.2)
Total Protein: 7.9 g/dL (ref 6.5–8.1)

## 2021-08-09 LAB — CBC WITH DIFFERENTIAL/PLATELET
Abs Immature Granulocytes: 0.06 10*3/uL (ref 0.00–0.07)
Basophils Absolute: 0 10*3/uL (ref 0.0–0.1)
Basophils Relative: 0 %
Eosinophils Absolute: 0.1 10*3/uL (ref 0.0–0.5)
Eosinophils Relative: 1 %
HCT: 56.9 % — ABNORMAL HIGH (ref 39.0–52.0)
Hemoglobin: 19.6 g/dL — ABNORMAL HIGH (ref 13.0–17.0)
Immature Granulocytes: 1 %
Lymphocytes Relative: 6 %
Lymphs Abs: 0.6 10*3/uL — ABNORMAL LOW (ref 0.7–4.0)
MCH: 31.3 pg (ref 26.0–34.0)
MCHC: 34.4 g/dL (ref 30.0–36.0)
MCV: 90.9 fL (ref 80.0–100.0)
Monocytes Absolute: 0.9 10*3/uL (ref 0.1–1.0)
Monocytes Relative: 9 %
Neutro Abs: 8.3 10*3/uL — ABNORMAL HIGH (ref 1.7–7.7)
Neutrophils Relative %: 83 %
Platelets: 249 10*3/uL (ref 150–400)
RBC: 6.26 MIL/uL — ABNORMAL HIGH (ref 4.22–5.81)
RDW: 12.1 % (ref 11.5–15.5)
WBC: 10 10*3/uL (ref 4.0–10.5)
nRBC: 0 % (ref 0.0–0.2)

## 2021-08-09 LAB — RAPID URINE DRUG SCREEN, HOSP PERFORMED
Amphetamines: NOT DETECTED
Barbiturates: NOT DETECTED
Benzodiazepines: NOT DETECTED
Cocaine: NOT DETECTED
Opiates: NOT DETECTED
Tetrahydrocannabinol: NOT DETECTED

## 2021-08-09 LAB — URINALYSIS, ROUTINE W REFLEX MICROSCOPIC
Bacteria, UA: NONE SEEN
Bilirubin Urine: NEGATIVE
Glucose, UA: NEGATIVE mg/dL
Ketones, ur: NEGATIVE mg/dL
Leukocytes,Ua: NEGATIVE
Nitrite: NEGATIVE
Protein, ur: >=300 mg/dL — AB
Specific Gravity, Urine: 1.023 (ref 1.005–1.030)
pH: 5 (ref 5.0–8.0)

## 2021-08-09 LAB — LIPASE, BLOOD: Lipase: 29 U/L (ref 11–51)

## 2021-08-09 LAB — ETHANOL: Alcohol, Ethyl (B): <10 mg/dL (ref <10)

## 2021-08-09 MED ORDER — LACTATED RINGERS IV BOLUS
1000.0000 mL | Freq: Once | INTRAVENOUS | Status: AC
  Administered 2021-08-09: 1000 mL via INTRAVENOUS

## 2021-08-09 MED ORDER — ONDANSETRON HCL 4 MG PO TABS: 4.0000 mg | ORAL_TABLET | Freq: Three times a day (TID) | ORAL | 0 refills | Status: DC | PRN

## 2021-08-09 MED ORDER — FAMOTIDINE 20 MG PO TABS: 20.0000 mg | ORAL_TABLET | Freq: Two times a day (BID) | ORAL | 0 refills | Status: DC

## 2021-08-09 MED ORDER — IOHEXOL 300 MG/ML  SOLN
100.0000 mL | Freq: Once | INTRAMUSCULAR | Status: AC | PRN
  Administered 2021-08-09: 100 mL via INTRAVENOUS

## 2021-08-09 MED ORDER — ONDANSETRON HCL 4 MG/2ML IJ SOLN
4.0000 mg | Freq: Once | INTRAMUSCULAR | Status: AC
  Administered 2021-08-09: 4 mg via INTRAVENOUS
  Filled 2021-08-09: qty 2

## 2021-08-09 MED ORDER — ONDANSETRON 4 MG PO TBDP
4.0000 mg | ORAL_TABLET | Freq: Once | ORAL | Status: AC
  Administered 2021-08-09: 4 mg via ORAL
  Filled 2021-08-09: qty 1

## 2021-08-09 MED ORDER — MORPHINE SULFATE (PF) 4 MG/ML IV SOLN
4.0000 mg | Freq: Once | INTRAVENOUS | Status: AC
  Administered 2021-08-09: 4 mg via INTRAVENOUS
  Filled 2021-08-09: qty 1

## 2021-08-09 NOTE — ED Notes (Signed)
Pt ambulated to bathroom with no assistance, pt is now resting and family is at bedside.  ?

## 2021-08-09 NOTE — ED Provider Triage Note (Signed)
Emergency Medicine Provider Triage Evaluation Note ? ?Caleb Blevins , a 26 y.o. male  was evaluated in triage.  Pt complains of lower abdominal pain-all across lower abdomen.  States vomiting with blood in it.  Denies blood in stool.  No fever/chills.  No prior abdominal surgeries.  No hx of GI issues.  Ate fried chicken, pintos, and collard greens tonight.  Also admits to EtOH earlier tonight. ? ?Review of Systems  ?Positive: Abdominal pain, vomiting ?Negative: fever ? ?Physical Exam  ?BP 121/64 (BP Location: Left Arm)   Pulse 91   Temp 97.8 ?F (36.6 ?C) (Oral)   Resp 18   SpO2 100%  ?Gen:   Awake, no distress   ?Resp:  Normal effort  ?MSK:   Moves extremities without difficulty  ?Other:  Writhing around in triage chair but able to stand and walk to bathroom without issue ? ?Medical Decision Making  ?Medically screening exam initiated at 1:03 AM.  Appropriate orders placed.  Caleb Blevins was informed that the remainder of the evaluation will be completed by another provider, this initial triage assessment does not replace that evaluation, and the importance of remaining in the ED until their evaluation is complete. ? ?Abdominal pain, vomiting.  Admits to eating large meal and drinking EtOH earlier tonight.  Labs ordered. ?  ?Garlon Hatchet, PA-C ?08/09/21 0107 ? ?

## 2021-08-09 NOTE — ED Triage Notes (Signed)
Pt reported to ED with c/o severe pain to bilateral lower abdomen and vomiting blood since approximately 2000. Pt also endorses diarrhea. Reports eating heavy meal cooked by mom at 2pm today.  Endorses ETOH usage.  ?

## 2021-08-09 NOTE — ED Provider Notes (Signed)
?[facility] EMERGENCY DEPARTMENT ?Provider Note ? ? ?CSN: [id number] ?Arrival date & time: 08/09/21  0046 ? ?  ? ?History ? ?Chief Complaint  ?Patient presents with  ? Hematemesis  ? ? ?KENDEL KROSS is a 26 y.o. male. ? ?The history is provided by the patient.  ?HABEN BILLINGSLEY is a 26 y.o. male who presents to the Emergency Department complaining of AP, V/D.  He developed abdominal pain at 7pm later followed by diarrhea and later had vomiting.  He has had 8+ episodes of gold diarrhea and 6+ episodes of gold emesis.  He later vomited blood mixed with mucous.  He has generalized abdominal pain.  Has chills.  No dysuria.  No known bad food.  No chest pain.   ? ?Has a hx/o asthma, no additional medical problems.   ?  ? ?Home Medications ?Prior to Admission medications   ?Medication Sig Start Date End Date Taking? Authorizing Provider  ?albuterol (VENTOLIN HFA) 108 (90 Base) MCG/ACT inhaler Inhale 2 puffs into the lungs every 6 (six) hours as needed for wheezing or shortness of breath. 08/03/21 09/02/21 Yes Leath-Warren, Sadie Haber, NP  ?famotidine (PEPCID) 20 MG tablet Take 1 tablet (20 mg total) by mouth 2 (two) times daily. 08/09/21  Yes Tilden Fossa, MD  ?ondansetron [facility]) 4 MG tablet Take 1 tablet (4 mg total) by mouth every 8 (eight) hours as needed for nausea or vomiting. 08/09/21  Yes Tilden Fossa, MD  ?triamcinolone cream (KENALOG) 0.1 % Apply 1 application topically 2 (two) times daily. 12/10/20  Yes Wurst, [place], PA-C  ?hydrOXYzine (ATARAX/VISTARIL) 25 MG tablet Take 1 tablet (25 mg total) by mouth every 6 (six) hours. ?Patient not taking: Reported on 08/09/2021 12/10/20   Alvino Chapel [place], PA-C  ?loperamide (IMODIUM) 2 MG capsule Take 1 capsule (2 mg total) by mouth 4 (four) times daily as needed for diarrhea or loose stools. ?Patient not taking: Reported on 08/09/2021 02/04/21   Rhys Martini, PA-C  ?metroNIDAZOLE (FLAGYL) 500 MG tablet Take 1 tablet (500 mg total) by  mouth 2 (two) times daily. ?Patient not taking: Reported on 08/09/2021 04/24/21   Jannifer Franklin, MD  ?montelukast (SINGULAIR) 10 MG tablet Take 1 tablet (10 mg total) by mouth at bedtime. ?Patient not taking: Reported on 08/09/2021 08/03/21 09/02/21  Leath-Warren, Sadie Haber, NP  ?   ? ?Allergies    ?Patient has no known allergies.   ? ?Review of Systems   ?Review of Systems  ?All other systems reviewed and are negative. ? ?Physical Exam ?Updated Vital Signs ?BP 114/74   Pulse 95   Temp 97.8 ?F (36.6 ?C) (Oral)   Resp 16   SpO2 99%  ?Physical Exam ?Vitals and nursing note reviewed.  ?Constitutional:   ?   Appearance: He is well-developed.  ?HENT:  ?   Head: Normocephalic and atraumatic.  ?Cardiovascular:  ?   Rate and Rhythm: Normal rate and regular rhythm.  ?Pulmonary:  ?   Effort: Pulmonary effort is normal. No respiratory distress.  ?Abdominal:  ?   Palpations: Abdomen is soft.  ?   Tenderness: There is no guarding or rebound.  ?   Comments: Mild to moderate generalized abdominal tenderness  ?Musculoskeletal:     ?   General: No tenderness.  ?Skin: ?   General: Skin is warm and dry.  ?Neurological:  ?   Mental Status: He is alert and oriented to person, place, and time.  ?Psychiatric:     ?  Behavior: Behavior normal.  ? ? ?ED Results / Procedures / Treatments   ?Labs ?(all labs ordered are listed, but only abnormal results are displayed) ?Labs Reviewed  ?CBC WITH DIFFERENTIAL/PLATELET - Abnormal; Notable for the following components:  ?    Result Value  ? RBC 6.26 (*)   ? Hemoglobin 19.6 (*)   ? HCT 56.9 (*)   ? Neutro Abs 8.3 (*)   ? Lymphs Abs 0.6 (*)   ? All other components within normal limits  ?COMPREHENSIVE METABOLIC PANEL - Abnormal; Notable for the following components:  ? Glucose, Bld 128 (*)   ? Creatinine, Ser 1.91 (*)   ? GFR, Estimated 48 (*)   ? All other components within normal limits  ?URINALYSIS, ROUTINE W REFLEX MICROSCOPIC - Abnormal; Notable for the following components:  ? Color, Urine  AMBER (*)   ? APPearance HAZY (*)   ? Hgb urine dipstick SMALL (*)   ? Protein, ur >=300 (*)   ? All other components within normal limits  ?LIPASE, BLOOD  ?ETHANOL  ?RAPID URINE DRUG SCREEN, HOSP PERFORMED  ? ? ?EKG ?None ? ?Radiology ?CT Abdomen Pelvis W Contrast ? ?Result Date: 08/09/2021 ?CLINICAL DATA:  Abdominal pain.  Vomiting. EXAM: CT ABDOMEN AND PELVIS WITH CONTRAST TECHNIQUE: Multidetector CT imaging of the abdomen and pelvis was performed using the standard protocol following bolus administration of intravenous contrast. RADIATION DOSE REDUCTION: This exam was performed according to the departmental dose-optimization program which includes automated exposure control, adjustment of the mA and/or kV according to patient size and/or use of iterative reconstruction technique. CONTRAST:  131mL OMNIPAQUE IOHEXOL 300 MG/ML  SOLN COMPARISON:  No comparison studies available. FINDINGS: Lower chest: Unremarkable. Hepatobiliary: No suspicious focal abnormality within the liver parenchyma. There is no evidence for gallstones, gallbladder wall thickening, or pericholecystic fluid. No intrahepatic or extrahepatic biliary dilation. Pancreas: No focal mass lesion. No dilatation of the main duct. No intraparenchymal cyst. No peripancreatic edema. Spleen: No splenomegaly. No focal mass lesion. Adrenals/Urinary Tract: No adrenal nodule or mass. Kidneys unremarkable. No evidence for hydroureter. The urinary bladder appears normal for the degree of distention. Stomach/Bowel: Stomach is unremarkable. No gastric wall thickening. No evidence of outlet obstruction. Duodenum is normally positioned as is the ligament of Treitz. No small bowel wall thickening. No small bowel dilatation. The terminal ileum is normal. The appendix is normal. No gross colonic mass. No colonic wall thickening. Vascular/Lymphatic: No abdominal aortic aneurysm. There is no gastrohepatic or hepatoduodenal ligament lymphadenopathy. No retroperitoneal or  mesenteric lymphadenopathy. No pelvic sidewall lymphadenopathy. Reproductive: The prostate gland and seminal vesicles are unremarkable. Other: No intraperitoneal free fluid. Musculoskeletal: No worrisome lytic or sclerotic osseous abnormality. IMPRESSION: No acute findings in the abdomen or pelvis. Specifically, no findings to explain the patient's history of abdominal pain with vomiting. Electronically Signed   By: Misty Stanley M.D.   On: 08/09/2021 07:12  ? ?DG Chest Port 1 View ? ?Result Date: 08/09/2021 ?CLINICAL DATA:  26 year old male with hematemesis. EXAM: PORTABLE CHEST 1 VIEW COMPARISON:  Chest radiographs 01/25/2008. FINDINGS: Portable AP semi upright view at 0600 hours. Low lung volumes. Mediastinal contours remain within normal limits. Visualized tracheal air column is within normal limits. Mild crowding of lung markings but otherwise Allowing for portable technique the lungs are clear. No pneumothorax. No pneumoperitoneum. Paucity of bowel gas in the upper abdomen. No osseous abnormality identified. IMPRESSION: Low lung volumes, otherwise no cardiopulmonary abnormality. Electronically Signed   By: Herminio Heads.D.  On: 08/09/2021 06:07   ? ?Procedures ?Procedures  ? ? ?Medications Ordered in ED ?Medications  ?ondansetron (ZOFRAN-ODT) disintegrating tablet 4 mg (4 mg Oral Given 08/09/21 0130)  ?lactated ringers bolus 1,000 mL (1,000 mLs Intravenous New Bag/Given 08/09/21 [id number])  ?ondansetron [facility] injection 4 mg (4 mg Intravenous Given 08/09/21 0618)  ?morphine (PF) 4 MG/ML injection 4 mg (4 mg Intravenous Given 08/09/21 0619)  ?iohexol (OMNIPAQUE) 300 MG/ML solution 100 mL (100 mLs Intravenous Contrast Given 08/09/21 0642)  ? ? ?ED Course/ Medical Decision Making/ A&P ?  ?                        ?Medical Decision Making ?Amount and/or Complexity of Data Reviewed ?Radiology: ordered. ? ?Risk ?Prescription drug management. ? ? ?Patient here for evaluation of abdominal pain, vomiting and diarrhea.  He did have  hematemesis after several episodes of gold colored emesis.  He does have generalized tenderness on examination.  CBC with hemoconcentration with increase in his hemoglobin.  CT abdomen pelvis was obtained, which

## 2021-11-09 ENCOUNTER — Ambulatory Visit (HOSPITAL_COMMUNITY)
Admission: RE | Admit: 2021-11-09 | Discharge: 2021-11-09 | Disposition: A | Discharge status: Home / Self Care | Payer: Managed Care, Other (non HMO) | Source: Ambulatory Visit | Attending: Physician Assistant | Admitting: Physician Assistant

## 2021-11-09 ENCOUNTER — Encounter (HOSPITAL_COMMUNITY): Payer: Self-pay

## 2021-11-09 VITALS — BP 140/92 | HR 69 | Temp 98.1°F | Resp 16

## 2021-11-09 DIAGNOSIS — M79675 Pain in left toe(s): Secondary | ICD-10-CM

## 2021-11-09 DIAGNOSIS — L03032 Cellulitis of left toe: Secondary | ICD-10-CM

## 2021-11-09 MED ORDER — SULFAMETHOXAZOLE-TRIMETHOPRIM 800-160 MG PO TABS: 1.0000 | ORAL_TABLET | Freq: Two times a day (BID) | ORAL | 0 refills | Status: AC

## 2021-11-09 NOTE — ED Triage Notes (Signed)
Patient c/o LFT foot pain that started 2 weeks ago.   Patient denies trauma to foot.   Patient endorses more pain in greater toe.   Patient endorses swelling upon onset of symptoms.   Patient endorses a blister has formed around greater toe.   Patient hasn't taken any medications for symptoms.

## 2021-11-09 NOTE — ED Provider Notes (Signed)
[facility]    CSN: [id number] Arrival date & time: 11/09/21  1156      History   Chief Complaint Chief Complaint  Patient presents with   Foot Pain    HPI GEROGE Blevins is a 26 y.o. male.   26 year old male presents with left toe pain.  Patient relates that he gets a pedicure on a regular basis, after the last pedicure several days later he started developing soreness on the second toe digit.  Patient indicates that the soreness became worse and he had some mild redness and swelling and a white area developed around the nail margin.  Patient relates that the area is very tender, hurts to walk, and hurts to touch.  Patient relates that he has not having any fever or chills, and no injury to the area   Foot Pain    Past Medical History:  Diagnosis Date   Asthma     Patient Active Problem List   Diagnosis Date Noted   Midline low back pain without sciatica 02/22/2015    History reviewed. No pertinent surgical history.     Home Medications    Prior to Admission medications   Medication Sig Start Date End Date Taking? Authorizing Provider  sulfamethoxazole-trimethoprim (BACTRIM DS) 800-160 MG tablet Take 1 tablet by mouth 2 (two) times daily for 7 days. 11/09/21 11/16/21 Yes Ellsworth Lennox, PA-C  albuterol (VENTOLIN HFA) 108 (90 Base) MCG/ACT inhaler Inhale 2 puffs into the lungs every 6 (six) hours as needed for wheezing or shortness of breath. 08/03/21 09/02/21  Leath-Warren, Sadie Haber, NP  famotidine (PEPCID) 20 MG tablet Take 1 tablet (20 mg total) by mouth 2 (two) times daily. 08/09/21   Tilden Fossa, MD  hydrOXYzine (ATARAX/VISTARIL) 25 MG tablet Take 1 tablet (25 mg total) by mouth every 6 (six) hours. Patient not taking: Reported on 08/09/2021 12/10/20   Wurst, [place], PA-C  loperamide (IMODIUM) 2 MG capsule Take 1 capsule (2 mg total) by mouth 4 (four) times daily as needed for diarrhea or loose stools. Patient not taking: Reported on 08/09/2021  02/04/21   Rhys Martini, PA-C  metroNIDAZOLE (FLAGYL) 500 MG tablet Take 1 tablet (500 mg total) by mouth 2 (two) times daily. Patient not taking: Reported on 08/09/2021 04/24/21   Jannifer Franklin, MD  montelukast (SINGULAIR) 10 MG tablet Take 1 tablet (10 mg total) by mouth at bedtime. Patient not taking: Reported on 08/09/2021 08/03/21 09/02/21  Leath-Warren, Sadie Haber, NP  ondansetron (ZOFRAN) 4 MG tablet Take 1 tablet (4 mg total) by mouth every 8 (eight) hours as needed for nausea or vomiting. 08/09/21   Tilden Fossa, MD  triamcinolone cream (KENALOG) 0.1 % Apply 1 application topically 2 (two) times daily. 12/10/20   Rennis Harding, PA-C    Family History Family History  Problem Relation Age of Onset   Hypertension Mother     Social History Social History   Tobacco Use   Smoking status: Every Day    Packs/day: 1.00    Types: Cigarettes   Smokeless tobacco: Never  Vaping Use   Vaping Use: Never used  Substance Use Topics   Alcohol use: Yes    Comment: occasionally   Drug use: No     Allergies   Patient has no known allergies.   Review of Systems Review of Systems  Skin:  Positive for wound (left toe 2nd digit pain and swelling).     Physical Exam Triage Vital Signs ED Triage Vitals  Enc  Vitals Group     BP 11/09/21 1219 (!) 140/92     Pulse Rate 11/09/21 1219 69     Resp 11/09/21 1219 16     Temp 11/09/21 1219 98.1 F (36.7 C)     Temp Source 11/09/21 1219 Oral     SpO2 11/09/21 1219 95 %     Weight --      Height --      Head Circumference --      Peak Flow --      Pain Score 11/09/21 1223 3     Pain Loc --      Pain Edu? --      Excl. in GC? --    No data found.  Updated Vital Signs BP (!) 140/92 (BP Location: Left Arm)   Pulse 69   Temp 98.1 F (36.7 C) (Oral)   Resp 16   SpO2 95%   Visual Acuity Right Eye Distance:   Left Eye Distance:   Bilateral Distance:    Right Eye Near:   Left Eye Near:    Bilateral Near:     Physical  Exam Constitutional:      Appearance: Normal appearance.  Skin:    Comments: Eft foot: Second digit with paronychia which involves the medial aspect of the nail margin, mild redness and swelling present.  Motion is normal. Procedure: Area was cleaned with alcohol, 18-gauge needle was used to create an opening at the middle of the paronychia area, purulent material was expressed, no complications.  Neurological:     Mental Status: He is alert.      UC Treatments / Results  Labs (all labs ordered are listed, but only abnormal results are displayed) Labs Reviewed - No data to display  EKG   Radiology No results found.  Procedures Procedures (including critical care time)  Medications Ordered in UC Medications - No data to display  Initial Impression / Assessment and Plan / UC Course  I have reviewed the triage vital signs and the nursing notes.  Pertinent labs & imaging results that were available during my care of the patient were reviewed by me and considered in my medical decision making (see chart for details).    Plan: 1.  Advised frequent Epsom salt soaks over the next couple days to help decrease pain and swelling. 2.  Advised take ibuprofen or Tylenol to help reduce the pain. 3.  Advised Bactrim DS 1 every 12 hours to treat the infection of the paronychia. 4.  Advised to follow-up with PCP or return to urgent care if symptoms fail to improve. Final Clinical Impressions(s) / UC Diagnoses   Final diagnoses:  Pain in toe of left foot  Paronychia, toe, left     Discharge Instructions      Advise Epsom salt soaks frequently to reduce the pain and swelling of the toe. Advised to take ibuprofen or Tylenol on a regular basis over the next 24 to 48 hours to help reduce pain. Advised take Bactrim DS 1 every 12 hours for the next 5 to 7 days till the infection resolves.     ED Prescriptions     Medication Sig Dispense Auth. Provider    sulfamethoxazole-trimethoprim (BACTRIM DS) 800-160 MG tablet Take 1 tablet by mouth 2 (two) times daily for 7 days. 14 tablet Ellsworth Lennox, PA-C      PDMP not reviewed this encounter.   Ellsworth Lennox, PA-C 11/09/21 1248

## 2021-11-09 NOTE — Discharge Instructions (Signed)
Advise Epsom salt soaks frequently to reduce the pain and swelling of the toe. Advised to take ibuprofen or Tylenol on a regular basis over the next 24 to 48 hours to help reduce pain. Advised take Bactrim DS 1 every 12 hours for the next 5 to 7 days till the infection resolves.

## 2021-12-15 ENCOUNTER — Ambulatory Visit
Admission: EM | Admit: 2021-12-15 | Discharge: 2021-12-15 | Disposition: A | Discharge status: Home / Self Care | Payer: Managed Care, Other (non HMO) | Attending: Nurse Practitioner | Admitting: Nurse Practitioner

## 2021-12-15 DIAGNOSIS — K219 Gastro-esophageal reflux disease without esophagitis: Secondary | ICD-10-CM | POA: Diagnosis not present

## 2021-12-15 DIAGNOSIS — J4541 Moderate persistent asthma with (acute) exacerbation: Secondary | ICD-10-CM | POA: Diagnosis not present

## 2021-12-15 MED ORDER — OMEPRAZOLE 20 MG PO CPDR
20.0000 mg | DELAYED_RELEASE_CAPSULE | Freq: Every day | ORAL | 0 refills | Status: DC
Start: 2021-12-15 — End: 2023-07-08

## 2021-12-15 MED ORDER — ARNUITY ELLIPTA 100 MCG/ACT IN AEPB: 1.0000 | INHALATION_SPRAY | Freq: Every day | RESPIRATORY_TRACT | 1 refills | Status: DC

## 2021-12-15 MED ORDER — ALBUTEROL SULFATE HFA 108 (90 BASE) MCG/ACT IN AERS: 2.0000 | INHALATION_SPRAY | Freq: Four times a day (QID) | RESPIRATORY_TRACT | 1 refills | Status: DC | PRN

## 2021-12-15 MED ORDER — PREDNISONE 20 MG PO TABS: 40.0000 mg | ORAL_TABLET | Freq: Every day | ORAL | 0 refills | Status: AC

## 2021-12-15 MED ORDER — MONTELUKAST SODIUM 10 MG PO TABS: 10.0000 mg | ORAL_TABLET | Freq: Every day | ORAL | 0 refills | Status: DC

## 2021-12-15 NOTE — Discharge Instructions (Signed)
-   Please start on the Arnuity inhaler daily for asthma maintenance.  Please also start on Singulair daily for asthma maintenance.  You can use albuterol every 6 hours as needed for wheezing or shortness of breath.  Please continue working on quitting smoking. -Start on the prednisone 40 mg daily for 5 days to help with the wheezing and inflammation in your lungs. -Please also start on the omeprazole 20 mg daily to help with acid reflux.  Take this daily on an empty stomach 30 minutes before food.

## 2021-12-15 NOTE — ED Provider Notes (Signed)
[facility]    CSN: [id number] Arrival date & time: 12/15/21  1254      History   Chief Complaint Chief Complaint  Patient presents with   Asthma    HPI Caleb Blevins is a 26 y.o. male.   Patients presents for asthma "flare" that began last night while at work.  Reports he works in a [employer].  Endorses chest tightness, wheezing, shortness of breath.  Denies significant cough, congestion, sore throat, ear pain, abdominal pain, nausea, vomiting, fatigue, and decreased energy.    Patient reports history of asthma as a child.  Has been using albuterol inhaler ~4 times per week at night for wheezing/shortness of breath for the past few months.  He is requesting a refill today.  Endorses smoking Black and milds daily.    Also endorses some indigestion/heartburn that often wakes him up at night.  Reports he has stopped eating tomato based foods and sleeps with multiple pillows to help with this.  Denies dysphagia, odynphagia, hematemesis.  Does not take anything for these symptoms.      Past Medical History:  Diagnosis Date   Asthma     Patient Active Problem List   Diagnosis Date Noted   Midline low back pain without sciatica 02/22/2015    History reviewed. No pertinent surgical history.     Home Medications    Prior to Admission medications   Medication Sig Start Date End Date Taking? Authorizing Provider  Fluticasone Furoate (ARNUITY ELLIPTA) 100 MCG/ACT AEPB Inhale 1 puff into the lungs daily. 12/15/21  Yes Valentino Nose, NP  omeprazole (PRILOSEC) 20 MG capsule Take 1 capsule (20 mg total) by mouth daily. 12/15/21  Yes Valentino Nose, NP  predniSONE (DELTASONE) 20 MG tablet Take 2 tablets (40 mg total) by mouth daily for 5 days. 12/15/21 12/20/21 Yes Valentino Nose, NP  albuterol (VENTOLIN HFA) 108 (90 Base) MCG/ACT inhaler Inhale 2 puffs into the lungs every 6 (six) hours as needed for wheezing or shortness of breath. 12/15/21   Valentino Nose, NP  famotidine (PEPCID) 20 MG tablet Take 1 tablet (20 mg total) by mouth 2 (two) times daily. 08/09/21   Tilden Fossa, MD  hydrOXYzine (ATARAX/VISTARIL) 25 MG tablet Take 1 tablet (25 mg total) by mouth every 6 (six) hours. Patient not taking: Reported on 08/09/2021 12/10/20   Wurst, [place], PA-C  montelukast (SINGULAIR) 10 MG tablet Take 1 tablet (10 mg total) by mouth at bedtime. 12/15/21 01/14/22  Valentino Nose, NP  ondansetron (ZOFRAN) 4 MG tablet Take 1 tablet (4 mg total) by mouth every 8 (eight) hours as needed for nausea or vomiting. 08/09/21   Tilden Fossa, MD  triamcinolone cream (KENALOG) 0.1 % Apply 1 application topically 2 (two) times daily. 12/10/20   Rennis Harding, PA-C    Family History Family History  Problem Relation Age of Onset   Hypertension Mother     Social History Social History   Tobacco Use   Smoking status: Every Day    Packs/day: 1.00    Types: Cigarettes   Smokeless tobacco: Never  Vaping Use   Vaping Use: Never used  Substance Use Topics   Alcohol use: Yes    Comment: occasionally   Drug use: No     Allergies   Patient has no known allergies.   Review of Systems Review of Systems Per HPI  Physical Exam Triage Vital Signs ED Triage Vitals  Enc Vitals Group  BP 12/15/21 1330 137/89     Pulse Rate 12/15/21 1330 71     Resp 12/15/21 1330 18     Temp 12/15/21 1330 98.3 F (36.8 C)     Temp Source 12/15/21 1330 Oral     SpO2 12/15/21 1330 98 %     Weight --      Height --      Head Circumference --      Peak Flow --      Pain Score 12/15/21 1329 0     Pain Loc --      Pain Edu? --      Excl. in GC? --    No data found.  Updated Vital Signs BP 137/89 (BP Location: Right Arm)   Pulse 71   Temp 98.3 F (36.8 C) (Oral)   Resp 18   SpO2 98%   Visual Acuity Right Eye Distance:   Left Eye Distance:   Bilateral Distance:    Right Eye Near:   Left Eye Near:    Bilateral Near:     Physical Exam Vitals  and nursing note reviewed.  Constitutional:      General: He is not in acute distress.    Appearance: Normal appearance. He is obese. He is not toxic-appearing.  HENT:     Head: Normocephalic and atraumatic.     Right Ear: Tympanic membrane, ear canal and external ear normal.     Left Ear: Tympanic membrane, ear canal and external ear normal.     Nose: Nose normal. No congestion or rhinorrhea.     Mouth/Throat:     Mouth: Mucous membranes are moist.     Pharynx: Oropharynx is clear.  Eyes:     General: No scleral icterus. Cardiovascular:     Rate and Rhythm: Normal rate and regular rhythm.  Pulmonary:     Effort: Pulmonary effort is normal. No respiratory distress.     Breath sounds: Normal breath sounds. No wheezing, rhonchi or rales.  Abdominal:     General: Abdomen is flat. Bowel sounds are normal. There is no distension.     Palpations: Abdomen is soft. There is no mass.     Tenderness: There is no abdominal tenderness. There is no right CVA tenderness or left CVA tenderness.  Musculoskeletal:     Cervical back: Normal range of motion.  Lymphadenopathy:     Cervical: No cervical adenopathy.  Skin:    General: Skin is warm and dry.     Capillary Refill: Capillary refill takes less than 2 seconds.     Coloration: Skin is not jaundiced or pale.     Findings: No erythema.  Neurological:     Mental Status: He is alert and oriented to person, place, and time.  Psychiatric:        Behavior: Behavior is cooperative.      UC Treatments / Results  Labs (all labs ordered are listed, but only abnormal results are displayed) Labs Reviewed - No data to display  EKG   Radiology No results found.  Procedures Procedures (including critical care time)  Medications Ordered in UC Medications - No data to display  Initial Impression / Assessment and Plan / UC Course  I have reviewed the triage vital signs and the nursing notes.  Pertinent labs & imaging results that were  available during my care of the patient were reviewed by me and considered in my medical decision making (see chart for details).    Patient is  a pleasant, well-appearing 26 year old male presenting for asthma flare today.  History and examination are reassuring today.  Treat asthma exacerbation with prednisone 40 mg daily for 5 days.  Refills sent in for rescue inhaler.  Encouraged smoking cessation.  Start daily maintenance therapy for asthma with Arnuity and Singulair given multiple exacerbations this year.  Note given for work.  Given GERD symptoms, start daily PPI.  No red flags in history or on examination today.  Discussed avoidance of triggers.  Recommended establishing care with primary care provider for further evaluation/management-patient in agreement.  Will place in primary care assistance.  Recommended patient follow-up if symptoms persist or worsen despite treatment.  The patient was given the opportunity to ask questions.  All questions answered to their satisfaction.  The patient is in agreement to this plan.   Final Clinical Impressions(s) / UC Diagnoses   Final diagnoses:  Moderate persistent asthma with acute exacerbation  Gastroesophageal reflux disease, unspecified whether esophagitis present     Discharge Instructions      - Please start on the Arnuity inhaler daily for asthma maintenance.  Please also start on Singulair daily for asthma maintenance.  You can use albuterol every 6 hours as needed for wheezing or shortness of breath.  Please continue working on quitting smoking. -Start on the prednisone 40 mg daily for 5 days to help with the wheezing and inflammation in your lungs. -Please also start on the omeprazole 20 mg daily to help with acid reflux.  Take this daily on an empty stomach 30 minutes before food.    ED Prescriptions     Medication Sig Dispense Auth. Provider   montelukast (SINGULAIR) 10 MG tablet Take 1 tablet (10 mg total) by mouth at bedtime.  30 tablet Cathlean Marseilles A, NP   albuterol (VENTOLIN HFA) 108 (90 Base) MCG/ACT inhaler Inhale 2 puffs into the lungs every 6 (six) hours as needed for wheezing or shortness of breath. 18 g Cathlean Marseilles A, NP   Fluticasone Furoate (ARNUITY ELLIPTA) 100 MCG/ACT AEPB Inhale 1 puff into the lungs daily. 30 each Valentino Nose, NP   omeprazole (PRILOSEC) 20 MG capsule Take 1 capsule (20 mg total) by mouth daily. 30 capsule Cathlean Marseilles A, NP   predniSONE (DELTASONE) 20 MG tablet Take 2 tablets (40 mg total) by mouth daily for 5 days. 10 tablet Valentino Nose, NP      PDMP not reviewed this encounter.   Valentino Nose, NP 12/15/21 1526

## 2021-12-15 NOTE — ED Triage Notes (Signed)
Pt reports cough, wheezing, asthma flare up since lats night.

## 2022-01-08 ENCOUNTER — Ambulatory Visit
Admission: RE | Admit: 2022-01-08 | Discharge: 2022-01-08 | Disposition: A | Discharge status: Home / Self Care | Payer: Managed Care, Other (non HMO) | Source: Ambulatory Visit | Attending: Nurse Practitioner | Admitting: Nurse Practitioner

## 2022-01-08 VITALS — BP 141/89 | HR 75 | Temp 98.2°F | Resp 18

## 2022-01-08 DIAGNOSIS — B353 Tinea pedis: Secondary | ICD-10-CM

## 2022-01-08 MED ORDER — KETOCONAZOLE 2 % EX CREA: 1.0000 | TOPICAL_CREAM | Freq: Every day | CUTANEOUS | 0 refills | Status: AC

## 2022-01-08 NOTE — Discharge Instructions (Signed)
-   Apply the ketoconazole cream to both of your feet daily to help with the fungal infection.  Continue applying this for 1 week after the infection appears to get better to prevent it from coming back. -Call the number we sent you to establish care with a primary care provider for ongoing management if this does not help the infection to improve.

## 2022-01-08 NOTE — ED Triage Notes (Signed)
Pt reports bilateral foot pain x 1 day. States pain is related to athletes foot. Pt denies any pain at this moment.

## 2022-01-08 NOTE — ED Provider Notes (Signed)
[facility]    CSN: [id number] Arrival date & time: 01/08/22  1048      History   Chief Complaint Chief Complaint  Patient presents with   Appointment    1100   Foot Pain    HPI Caleb Blevins is a 26 y.o. male.   Patient presents with bilateral foot pain/burning that has been ongoing for the past few years.  He reports also that there is a rash that develops and itches intensely.  Reports the rashes largely to the bottom of his feet and in between his toes.  Also reports his toenails are discolored and starting to check.  Denies pain in the joints of his feet, rather it is in the skin.  He has tried multiple over-the-counter athlete's foot medications without relief.  Denies weakness with weightbearing or walking, morning stiffness, swelling, redness, bruising, numbness or tingling in his toes, and fevers, nausea/vomiting.    Past Medical History:  Diagnosis Date   Asthma     Patient Active Problem List   Diagnosis Date Noted   Midline low back pain without sciatica 02/22/2015    History reviewed. No pertinent surgical history.     Home Medications    Prior to Admission medications   Medication Sig Start Date End Date Taking? Authorizing Provider  ketoconazole (NIZORAL) 2 % cream Apply 1 Application topically daily. Apply daily to feet for 6 weeks consistently 01/08/22 02/19/22 Yes Cathlean Marseilles A, NP  albuterol (VENTOLIN HFA) 108 (90 Base) MCG/ACT inhaler Inhale 2 puffs into the lungs every 6 (six) hours as needed for wheezing or shortness of breath. 12/15/21   Valentino Nose, NP  famotidine (PEPCID) 20 MG tablet Take 1 tablet (20 mg total) by mouth 2 (two) times daily. 08/09/21   Tilden Fossa, MD  Fluticasone Furoate (ARNUITY ELLIPTA) 100 MCG/ACT AEPB Inhale 1 puff into the lungs daily. 12/15/21   Valentino Nose, NP  hydrOXYzine (ATARAX/VISTARIL) 25 MG tablet Take 1 tablet (25 mg total) by mouth every 6 (six) hours. Patient not taking:  Reported on 08/09/2021 12/10/20   Wurst, [place], PA-C  montelukast (SINGULAIR) 10 MG tablet Take 1 tablet (10 mg total) by mouth at bedtime. 12/15/21 01/14/22  Valentino Nose, NP  omeprazole (PRILOSEC) 20 MG capsule Take 1 capsule (20 mg total) by mouth daily. 12/15/21   Valentino Nose, NP  ondansetron (ZOFRAN) 4 MG tablet Take 1 tablet (4 mg total) by mouth every 8 (eight) hours as needed for nausea or vomiting. 08/09/21   Tilden Fossa, MD  triamcinolone cream (KENALOG) 0.1 % Apply 1 application topically 2 (two) times daily. 12/10/20   Rennis Harding, PA-C    Family History Family History  Problem Relation Age of Onset   Hypertension Mother     Social History Social History   Tobacco Use   Smoking status: Every Day    Packs/day: 1.00    Types: Cigarettes   Smokeless tobacco: Never  Vaping Use   Vaping Use: Never used  Substance Use Topics   Alcohol use: Yes    Comment: occasionally   Drug use: No     Allergies   Patient has no known allergies.   Review of Systems Review of Systems Per HPI  Physical Exam Triage Vital Signs ED Triage Vitals [01/08/22 1059]  Enc Vitals Group     BP (!) 141/89     Pulse Rate 75     Resp 18     Temp 98.2 F (  36.8 C)     Temp Source Oral     SpO2 97 %     Weight      Height      Head Circumference      Peak Flow      Pain Score 0     Pain Loc      Pain Edu?      Excl. in GC?    No data found.  Updated Vital Signs BP (!) 141/89 (BP Location: Right Arm)   Pulse 75   Temp 98.2 F (36.8 C) (Oral)   Resp 18   SpO2 97%   Visual Acuity Right Eye Distance:   Left Eye Distance:   Bilateral Distance:    Right Eye Near:   Left Eye Near:    Bilateral Near:     Physical Exam Vitals and nursing note reviewed.  Constitutional:      General: He is not in acute distress.    Appearance: Normal appearance. He is not toxic-appearing.  HENT:     Head: Normocephalic and atraumatic.     Mouth/Throat:     Mouth: Mucous  membranes are moist.     Pharynx: Oropharynx is clear.  Cardiovascular:     Pulses:          Dorsalis pedis pulses are 1+ on the right side and 1+ on the left side.       Posterior tibial pulses are 1+ on the right side and 1+ on the left side.  Pulmonary:     Effort: Pulmonary effort is normal. No respiratory distress.  Musculoskeletal:     Right foot: Normal range of motion and normal capillary refill. No swelling, deformity, laceration, tenderness, bony tenderness or crepitus. Normal pulse.     Left foot: Normal range of motion and normal capillary refill. No swelling, deformity, laceration, tenderness, bony tenderness or crepitus. Normal pulse.  Feet:     Right foot:     Skin integrity: Skin breakdown present. No erythema or warmth.     Toenail Condition: Right toenails are abnormally thick.     Left foot:     Skin integrity: Skin breakdown present. No erythema or warmth.     Toenail Condition: Left toenails are abnormally thick.     Comments: Moist, macerated skin in between toes bilaterally.  There are areas of dried skin on bilateral soles of feet.  Skin:    General: Skin is warm and dry.     Capillary Refill: Capillary refill takes less than 2 seconds.     Coloration: Skin is not jaundiced or pale.     Findings: No erythema.  Neurological:     Mental Status: He is alert and oriented to person, place, and time.  Psychiatric:        Behavior: Behavior is cooperative.      UC Treatments / Results  Labs (all labs ordered are listed, but only abnormal results are displayed) Labs Reviewed - No data to display  EKG   Radiology No results found.  Procedures Procedures (including critical care time)  Medications Ordered in UC Medications - No data to display  Initial Impression / Assessment and Plan / UC Course  I have reviewed the triage vital signs and the nursing notes.  Pertinent labs & imaging results that were available during my care of the patient were  reviewed by me and considered in my medical decision making (see chart for details).    Patient is a very pleasant,  well-appearing 26 year old male presenting for bilateral foot burning/athletes feet.  In triage, he is slightly hypertensive, however not tachycardic, not tachypneic, oxygenating well on room air, afebrile.  Examination of bilateral lower extremities reveals tinea pedis.  Treat with ketoconazole cream daily for 6 weeks.  Encouraged establishing care with primary care provider for ongoing management if no improvement with this treatment. Note given for work.  The patient was given the opportunity to ask questions.  All questions answered to their satisfaction.  The patient is in agreement to this plan.  Final Clinical Impressions(s) / UC Diagnoses   Final diagnoses:  Tinea pedis of both feet     Discharge Instructions      - Apply the ketoconazole cream to both of your feet daily to help with the fungal infection.  Continue applying this for 1 week after the infection appears to get better to prevent it from coming back. -Call the number we sent you to establish care with a primary care provider for ongoing management if this does not help the infection to improve.    ED Prescriptions     Medication Sig Dispense Auth. Provider   ketoconazole (NIZORAL) 2 % cream Apply 1 Application topically daily. Apply daily to feet for 6 weeks consistently 42 g Valentino Nose, NP      PDMP not reviewed this encounter.   Valentino Nose, NP 01/08/22 1151

## 2022-04-15 ENCOUNTER — Emergency Department (HOSPITAL_COMMUNITY): Payer: Managed Care, Other (non HMO)

## 2022-04-15 ENCOUNTER — Other Ambulatory Visit: Payer: Self-pay

## 2022-04-15 ENCOUNTER — Emergency Department (HOSPITAL_COMMUNITY)
Admission: EM | Admit: 2022-04-15 | Discharge: 2022-04-15 | Disposition: A | Discharge status: Home / Self Care | Payer: Managed Care, Other (non HMO) | Attending: Emergency Medicine | Admitting: Emergency Medicine

## 2022-04-15 DIAGNOSIS — M7918 Myalgia, other site: Secondary | ICD-10-CM

## 2022-04-15 DIAGNOSIS — Y92410 Unspecified street and highway as the place of occurrence of the external cause: Secondary | ICD-10-CM | POA: Insufficient documentation

## 2022-04-15 DIAGNOSIS — M542 Cervicalgia: Secondary | ICD-10-CM | POA: Insufficient documentation

## 2022-04-15 MED ORDER — LIDOCAINE 5 % EX PTCH
1.0000 | MEDICATED_PATCH | CUTANEOUS | Status: DC
  Administered 2022-04-15: 1 via TRANSDERMAL
  Filled 2022-04-15: qty 1

## 2022-04-15 MED ORDER — CYCLOBENZAPRINE HCL 10 MG PO TABS: 10.0000 mg | ORAL_TABLET | Freq: Two times a day (BID) | ORAL | 0 refills | Status: DC | PRN
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
## 2022-07-01 ENCOUNTER — Other Ambulatory Visit: Payer: Self-pay

## 2022-07-01 ENCOUNTER — Emergency Department (HOSPITAL_BASED_OUTPATIENT_CLINIC_OR_DEPARTMENT_OTHER)
Admission: EM | Admit: 2022-07-01 | Discharge: 2022-07-01 | Disposition: A | Discharge status: Home / Self Care | Payer: Self-pay | Attending: Emergency Medicine | Admitting: Emergency Medicine

## 2022-07-01 ENCOUNTER — Encounter (HOSPITAL_BASED_OUTPATIENT_CLINIC_OR_DEPARTMENT_OTHER): Payer: Self-pay

## 2022-07-01 DIAGNOSIS — J45909 Unspecified asthma, uncomplicated: Secondary | ICD-10-CM | POA: Insufficient documentation

## 2022-07-01 DIAGNOSIS — R1032 Left lower quadrant pain: Secondary | ICD-10-CM | POA: Insufficient documentation

## 2022-07-01 MED ORDER — TRAMADOL HCL 50 MG PO TABS: 50.0000 mg | ORAL_TABLET | Freq: Four times a day (QID) | ORAL | 0 refills | Status: DC | PRN

## 2022-07-01 MED ORDER — NAPROXEN 500 MG PO TABS: 500.0000 mg | ORAL_TABLET | Freq: Two times a day (BID) | ORAL | 0 refills | Status: DC

## 2022-07-01 NOTE — ED Provider Notes (Signed)
[facility] Provider Note   CSN: [id number] Arrival date & time: 07/01/22  0531     History  Chief Complaint  Patient presents with   Groin Pain    Caleb Blevins is a 27 y.o. male.  Patient is a 27 year old male with past medical history of asthma, GERD.  Patient presenting today with complaints of left groin pain.  He woke up this way yesterday morning.  Pain began in the absence of any injury or trauma or new activity.  He reports pain that is worsening when he attempts to ambulate.  He denies any weakness or numbness.  He denies any palpable masses or bulging.  The history is provided by the patient.       Home Medications Prior to Admission medications   Medication Sig Start Date End Date Taking? Authorizing Provider  albuterol (VENTOLIN HFA) 108 (90 Base) MCG/ACT inhaler Inhale 2 puffs into the lungs every 6 (six) hours as needed for wheezing or shortness of breath. 12/15/21   Eulogio Bear, NP  cyclobenzaprine (FLEXERIL) 10 MG tablet Take 1 tablet (10 mg total) by mouth 2 (two) times daily as needed for muscle spasms. 04/15/22   Fransico Meadow, MD  famotidine (PEPCID) 20 MG tablet Take 1 tablet (20 mg total) by mouth 2 (two) times daily. 08/09/21   Quintella Reichert, MD  Fluticasone Furoate (ARNUITY ELLIPTA) 100 MCG/ACT AEPB Inhale 1 puff into the lungs daily. 12/15/21   Eulogio Bear, NP  hydrOXYzine (ATARAX/VISTARIL) 25 MG tablet Take 1 tablet (25 mg total) by mouth every 6 (six) hours. Patient not taking: Reported on 08/09/2021 12/10/20   Wurst, [place], PA-C  montelukast (SINGULAIR) 10 MG tablet Take 1 tablet (10 mg total) by mouth at bedtime. 12/15/21 01/14/22  Eulogio Bear, NP  omeprazole (PRILOSEC) 20 MG capsule Take 1 capsule (20 mg total) by mouth daily. 12/15/21   Eulogio Bear, NP  ondansetron (ZOFRAN) 4 MG tablet Take 1 tablet (4 mg total) by mouth every 8 (eight) hours as needed for nausea or  vomiting. 08/09/21   Quintella Reichert, MD  triamcinolone cream (KENALOG) 0.1 % Apply 1 application topically 2 (two) times daily. 12/10/20   Wurst, [place], PA-C      Allergies    Patient has no known allergies.    Review of Systems   Review of Systems  All other systems reviewed and are negative.   Physical Exam Updated Vital Signs BP 129/87   Pulse 75   Temp 97.9 F (36.6 C) (Oral)   Resp 18   Ht 5' 8"$  (1.727 m)   Wt 106.6 kg   SpO2 98%   BMI 35.73 kg/m  Physical Exam Vitals and nursing note reviewed.  Constitutional:      General: He is not in acute distress.    Appearance: Normal appearance. He is not ill-appearing.  HENT:     Head: Normocephalic and atraumatic.  Pulmonary:     Effort: Pulmonary effort is normal.  Musculoskeletal:     Comments: There is tenderness to palpation overlying the hip flexor of the left thigh/groin.  There is no palpable lymph node or hernia.  There is no evidence for inguinal hernia.  He has pain with flexion of the hip.  DP pulses are palpable and motor and sensation are intact throughout the entire foot.  Skin:    General: Skin is warm and dry.  Neurological:     Mental Status: He is  alert.     ED Results / Procedures / Treatments   Labs (all labs ordered are listed, but only abnormal results are displayed) Labs Reviewed - No data to display  EKG None  Radiology No results found.  Procedures Procedures    Medications Ordered in ED Medications - No data to display  ED Course/ Medical Decision Making/ A&P  Patient presenting with complaints of pain in his left groin.  This seems clearly musculoskeletal in nature.  It is worse when he attempts to flex his hip.  I am unable to palpate a hernia or other concerning finding.  His leg is neurovascularly intact distally.  Patient to be treated with naproxen, tramadol, and is to follow-up if not improving.  I do not feel as though any imaging studies are indicated in the absence of  any trauma.  Final Clinical Impression(s) / ED Diagnoses Final diagnoses:  None    Rx / DC Orders ED Discharge Orders     None         Veryl Speak, MD 07/01/22 [phone]

## 2022-07-01 NOTE — ED Triage Notes (Signed)
Reports woke up on 06/30/22 with L groin pain. Denies injury. Respirations equal and nonlabored. Skin warm and dry.

## 2022-07-01 NOTE — Discharge Instructions (Addendum)
Begin taking naproxen as prescribed.  Begin taking tramadol as prescribed as needed for pain not relieved with naproxen.  Rest.  Follow-up with your primary doctor if symptoms are not improving in the next week.

## 2022-09-10 ENCOUNTER — Ambulatory Visit (HOSPITAL_COMMUNITY)
Admission: RE | Admit: 2022-09-10 | Discharge: 2022-09-10 | Disposition: A | Discharge status: Home / Self Care | Payer: Self-pay | Source: Ambulatory Visit | Attending: Nurse Practitioner | Admitting: Nurse Practitioner

## 2022-09-10 ENCOUNTER — Encounter (HOSPITAL_COMMUNITY): Payer: Self-pay

## 2022-09-10 ENCOUNTER — Other Ambulatory Visit: Payer: Self-pay

## 2022-09-10 VITALS — BP 139/93 | HR 93 | Temp 99.1°F | Resp 18

## 2022-09-10 DIAGNOSIS — Z202 Contact with and (suspected) exposure to infections with a predominantly sexual mode of transmission: Secondary | ICD-10-CM | POA: Insufficient documentation

## 2022-09-10 LAB — HIV ANTIBODY (ROUTINE TESTING W REFLEX): HIV Screen 4th Generation wRfx: NONREACTIVE

## 2022-09-10 MED ORDER — METRONIDAZOLE 500 MG PO TABS: 500.0000 mg | ORAL_TABLET | Freq: Two times a day (BID) | ORAL | 0 refills | Status: AC

## 2022-09-10 NOTE — ED Provider Notes (Signed)
[facility]    CSN: [id number] Arrival date & time: 09/10/22  1757      History   Chief Complaint Chief Complaint  Patient presents with   Appointment    6:00   Exposure to STD    HPI Caleb Blevins is a 27 y.o. male.   Patient presents today for STI testing.  Reports he has been exposed to trichomonas.  He denies penile discharge, urinary frequency or urgency.  No dysuria, abdominal pain, groin swelling, penile sores, rashes, or lesions.  No testicle pain or swelling.    Past Medical History:  Diagnosis Date   Asthma     Patient Active Problem List   Diagnosis Date Noted   Midline low back pain without sciatica 02/22/2015    History reviewed. No pertinent surgical history.     Home Medications    Prior to Admission medications   Medication Sig Start Date End Date Taking? Authorizing Provider  metroNIDAZOLE (FLAGYL) 500 MG tablet Take 1 tablet (500 mg total) by mouth 2 (two) times daily for 7 days. 09/10/22 09/17/22 Yes Valentino Nose, NP  albuterol (VENTOLIN HFA) 108 (90 Base) MCG/ACT inhaler Inhale 2 puffs into the lungs every 6 (six) hours as needed for wheezing or shortness of breath. Patient not taking: Reported on 09/10/2022 12/15/21   Valentino Nose, NP  cyclobenzaprine (FLEXERIL) 10 MG tablet Take 1 tablet (10 mg total) by mouth 2 (two) times daily as needed for muscle spasms. Patient not taking: Reported on 09/10/2022 04/15/22   Rondel Baton, MD  famotidine (PEPCID) 20 MG tablet Take 1 tablet (20 mg total) by mouth 2 (two) times daily. Patient not taking: Reported on 09/10/2022 08/09/21   Tilden Fossa, MD  Fluticasone Furoate (ARNUITY ELLIPTA) 100 MCG/ACT AEPB Inhale 1 puff into the lungs daily. Patient not taking: Reported on 09/10/2022 12/15/21   Valentino Nose, NP  hydrOXYzine (ATARAX/VISTARIL) 25 MG tablet Take 1 tablet (25 mg total) by mouth every 6 (six) hours. Patient not taking: Reported on 08/09/2021 12/10/20   Wurst,  [place], PA-C  montelukast (SINGULAIR) 10 MG tablet Take 1 tablet (10 mg total) by mouth at bedtime. Patient not taking: Reported on 09/10/2022 12/15/21 01/14/22  Valentino Nose, NP  naproxen (NAPROSYN) 500 MG tablet Take 1 tablet (500 mg total) by mouth 2 (two) times daily. Patient not taking: Reported on 09/10/2022 07/01/22   Geoffery Lyons, MD  omeprazole (PRILOSEC) 20 MG capsule Take 1 capsule (20 mg total) by mouth daily. Patient not taking: Reported on 09/10/2022 12/15/21   Valentino Nose, NP  ondansetron (ZOFRAN) 4 MG tablet Take 1 tablet (4 mg total) by mouth every 8 (eight) hours as needed for nausea or vomiting. Patient not taking: Reported on 09/10/2022 08/09/21   Tilden Fossa, MD  traMADol (ULTRAM) 50 MG tablet Take 1 tablet (50 mg total) by mouth every 6 (six) hours as needed. Patient not taking: Reported on 09/10/2022 07/01/22   Geoffery Lyons, MD  triamcinolone cream (KENALOG) 0.1 % Apply 1 application topically 2 (two) times daily. Patient not taking: Reported on 09/10/2022 12/10/20   Rennis Harding, PA-C    Family History Family History  Problem Relation Age of Onset   Hypertension Mother     Social History Social History   Tobacco Use   Smoking status: Every Day    Packs/day: 1    Types: Cigarettes   Smokeless tobacco: Never  Vaping Use   Vaping Use: Never used  Substance  Use Topics   Alcohol use: Yes    Comment: occasionally   Drug use: No     Allergies   Patient has no known allergies.   Review of Systems Review of Systems Per HPI  Physical Exam Triage Vital Signs ED Triage Vitals  Enc Vitals Group     BP 09/10/22 1814 (!) 139/93     Pulse Rate 09/10/22 1814 93     Resp 09/10/22 1814 18     Temp 09/10/22 1814 99.1 F (37.3 C)     Temp Source 09/10/22 1814 Oral     SpO2 09/10/22 1814 95 %     Weight --      Height --      Head Circumference --      Peak Flow --      Pain Score 09/10/22 1811 0     Pain Loc --      Pain Edu? --      Excl.  in GC? --    No data found.  Updated Vital Signs BP (!) 139/93 (BP Location: Left Arm) Comment (BP Location): large cuff  Pulse 93   Temp 99.1 F (37.3 C) (Oral)   Resp 18   SpO2 95%   Visual Acuity Right Eye Distance:   Left Eye Distance:   Bilateral Distance:    Right Eye Near:   Left Eye Near:    Bilateral Near:     Physical Exam Vitals and nursing note reviewed. Exam conducted with a chaperone present (Demetrice Rougemont).  Constitutional:      General: He is not in acute distress.    Appearance: Normal appearance. He is not toxic-appearing.  Pulmonary:     Effort: Pulmonary effort is normal. No respiratory distress.  Genitourinary:    Penis: Circumcised. No tenderness, discharge, swelling or lesions.      Testes: Normal.        Right: Mass, tenderness or swelling not present.        Left: Mass, tenderness or swelling not present.  Lymphadenopathy:     Lower Body: No right inguinal adenopathy. No left inguinal adenopathy.  Skin:    General: Skin is warm and dry.     Capillary Refill: Capillary refill takes less than 2 seconds.     Coloration: Skin is not jaundiced or pale.  Neurological:     Mental Status: He is alert and oriented to person, place, and time.     Motor: No weakness.     Gait: Gait normal.  Psychiatric:        Behavior: Behavior is cooperative.      UC Treatments / Results  Labs (all labs ordered are listed, but only abnormal results are displayed) Labs Reviewed  HIV ANTIBODY (ROUTINE TESTING W REFLEX)  RPR  CYTOLOGY, (ORAL, ANAL, URETHRAL) ANCILLARY ONLY    EKG   Radiology No results found.  Procedures Procedures (including critical care time)  Medications Ordered in UC Medications - No data to display  Initial Impression / Assessment and Plan / UC Course  I have reviewed the triage vital signs and the nursing notes.  Pertinent labs & imaging results that were available during my care of the patient were reviewed by me and  considered in my medical decision making (see chart for details).   Patient is well-appearing, normotensive, afebrile, not tachycardic, not tachypneic, oxygenating well on room air.    1. Exposure to STD Given known exposure to trichomonas, will treat with Flagyl 500 mg  twice daily for 7 days Refrain from sexual activity for 14 full days after starting treatment; also recommended avoiding alcohol while on treatment Cytology swab and HIV/syphilis labs are pending Recommended condom use with every sexual encounter  The patient was given the opportunity to ask questions.  All questions answered to their satisfaction.  The patient is in agreement to this plan.    Final Clinical Impressions(s) / UC Diagnoses   Final diagnoses:  Exposure to STD     Discharge Instructions      We are testing you today for trichomonas, gonorrhea, chlamydia, HIV, and syphilis.  You will see the results in MyChart and we will call you or notify you with any abnormal results later this week.  Since you have been exposed to trichomonas, we are treating you with Flagyl twice daily for 7 days.  Do not take this with alcohol and refrain from sexual activity for 2 to 4 weeks.  Recommend condoms with every sexual encounter moving forward to prevent STI.     ED Prescriptions     Medication Sig Dispense Auth. Provider   metroNIDAZOLE (FLAGYL) 500 MG tablet Take 1 tablet (500 mg total) by mouth 2 (two) times daily for 7 days. 14 tablet Valentino Nose, NP      PDMP not reviewed this encounter.   Valentino Nose, NP 09/10/22 [phone]

## 2022-09-10 NOTE — ED Triage Notes (Signed)
Patient is here for std testing.  Partner was diagnosed with trich.  Patient denies symptoms

## 2022-09-10 NOTE — Discharge Instructions (Signed)
We are testing you today for trichomonas, gonorrhea, chlamydia, HIV, and syphilis.  You will see the results in MyChart and we will call you or notify you with any abnormal results later this week.  Since you have been exposed to trichomonas, we are treating you with Flagyl twice daily for 7 days.  Do not take this with alcohol and refrain from sexual activity for 2 to 4 weeks.  Recommend condoms with every sexual encounter moving forward to prevent STI.

## 2022-09-11 LAB — CYTOLOGY, (ORAL, ANAL, URETHRAL) ANCILLARY ONLY
Chlamydia: NEGATIVE
Comment: NEGATIVE
Comment: NEGATIVE
Comment: NORMAL
Neisseria Gonorrhea: NEGATIVE
Trichomonas: NEGATIVE

## 2022-09-11 LAB — RPR: RPR Ser Ql: NONREACTIVE

## 2022-12-15 ENCOUNTER — Telehealth: Payer: Self-pay

## 2022-12-15 NOTE — Telephone Encounter (Signed)
Patient reports that he just started working and that his benefits will start soon and he will have insurance. I express to him that in our records there is no PCP and that I can help him with information on [facility], schedule for appointments, and transportation if needed, he express that he was at work. We schedule a telephone call for next Friday at 4pm, he mention that by then he should have more information about his insurance, he was very enthusiastic about having one, b/c he want to start getting his yearly physicals and preventive care.

## 2023-06-10 ENCOUNTER — Telehealth: Payer: Self-pay

## 2023-06-10 NOTE — Telephone Encounter (Signed)
Copied from CRM [id number]. Topic: Appointments - Scheduling Inquiry for Clinic >> Jun 10, 2023  9:54 AM Desma Mcgregor wrote: Reason for CRM: This patient is requesting Dr. Allena Katz as his pcp. If approved, please contact to schedule appt.

## 2023-06-24 ENCOUNTER — Ambulatory Visit: Payer: BC Managed Care – PPO | Admitting: Podiatry

## 2023-07-08 ENCOUNTER — Ambulatory Visit (INDEPENDENT_AMBULATORY_CARE_PROVIDER_SITE_OTHER): Payer: BC Managed Care – PPO | Admitting: Podiatry

## 2023-07-08 DIAGNOSIS — B351 Tinea unguium: Secondary | ICD-10-CM

## 2023-07-08 DIAGNOSIS — L603 Nail dystrophy: Secondary | ICD-10-CM | POA: Diagnosis not present

## 2023-07-08 DIAGNOSIS — M79675 Pain in left toe(s): Secondary | ICD-10-CM | POA: Diagnosis not present

## 2023-07-08 DIAGNOSIS — M79674 Pain in right toe(s): Secondary | ICD-10-CM | POA: Diagnosis not present

## 2023-07-08 NOTE — Progress Notes (Unsigned)
     Chief Complaint  Patient presents with   Nail Problem    " I have some discoloration to my nails, and not sure if there is an ingrown nail on my big toes and I would like a doctor to look at my feet"   HPI: 28 y.o. male presents today with concern of toenail discoloration to the bilateral great toenails.  He is not sure whether there is some ingrowing nail in the area.  States that the great toenails are also thick and would like treatment recommendations.  Past Medical History:  Diagnosis Date   Asthma    History reviewed. No pertinent surgical history.  No Known Allergies   Physical Exam: There are palpable pedal pulses bilateral.  No significant edema is appreciated.  The bilateral hallux nails are 3 to 4 mm thick, with yellow and brown discoloration, subungual debris, distal onycholysis (especially centrally), and pain with compression.  The remainder of the nails are within normal limits.  Epicritic sensation is intact.  No open lesions are noted.  Interspaces are clear  Assessment/Plan of Care: 1. Nail dystrophy   2. Pain due to onychomycosis of toenails of both feet     Discussed clinical findings with patient today.  The hallux nails were debrided with sterile nail nippers and a power debriding bur, and clippings of the hallux toenails were sent to [facility] laboratories for fungal culture.  The order was placed today.  Informed the patient that once we find out the causative organism of his probable fungus, we can then tailor his treatment to the specific organism.  He was amenable to this idea.  Will call the patient within the next 3 weeks to review the results and discussed topical versus oral medication options.  Will need to see the patient back in 3 to 4 months for a fungal nail recheck.   Clerance Lav, DPM, FACFAS [facility]     [address] [place], [place] [address]                Office [phone]   Fax [phone]

## 2023-07-09 ENCOUNTER — Encounter: Payer: Self-pay | Admitting: Podiatry

## 2023-07-22 ENCOUNTER — Other Ambulatory Visit: Payer: Self-pay | Admitting: Podiatry

## 2023-07-24 ENCOUNTER — Encounter: Payer: Self-pay | Admitting: Podiatry

## 2023-07-24 DIAGNOSIS — B351 Tinea unguium: Secondary | ICD-10-CM

## 2023-11-04 ENCOUNTER — Ambulatory Visit: Payer: BC Managed Care – PPO | Admitting: Podiatry

## 2023-11-25 ENCOUNTER — Encounter: Admitting: Podiatry

## 2023-11-25 NOTE — Progress Notes (Signed)
Patient did not show for scheduled appointment today.

## 2023-12-13 ENCOUNTER — Ambulatory Visit (HOSPITAL_COMMUNITY)
Admission: RE | Admit: 2023-12-13 | Discharge: 2023-12-13 | Disposition: A | Discharge status: Home / Self Care | Source: Ambulatory Visit | Attending: Family Medicine | Admitting: Family Medicine

## 2023-12-13 ENCOUNTER — Other Ambulatory Visit: Payer: Self-pay | Admitting: Family Medicine

## 2023-12-13 ENCOUNTER — Ambulatory Visit (INDEPENDENT_AMBULATORY_CARE_PROVIDER_SITE_OTHER): Payer: Self-pay | Admitting: Family Medicine

## 2023-12-13 VITALS — BP 160/104 | HR 72 | Resp 16 | Ht 68.0 in | Wt 255.0 lb

## 2023-12-13 DIAGNOSIS — Z720 Tobacco use: Secondary | ICD-10-CM

## 2023-12-13 DIAGNOSIS — I10 Essential (primary) hypertension: Secondary | ICD-10-CM | POA: Insufficient documentation

## 2023-12-13 DIAGNOSIS — B351 Tinea unguium: Secondary | ICD-10-CM | POA: Diagnosis not present

## 2023-12-13 DIAGNOSIS — R079 Chest pain, unspecified: Secondary | ICD-10-CM | POA: Diagnosis not present

## 2023-12-13 MED ORDER — TERBINAFINE HCL 250 MG PO TABS
250.0000 mg | ORAL_TABLET | Freq: Every day | ORAL | 2 refills | Status: DC
Start: 2023-12-13 — End: 2024-03-06

## 2023-12-13 MED ORDER — OLMESARTAN MEDOXOMIL 20 MG PO TABS
20.0000 mg | ORAL_TABLET | Freq: Every day | ORAL | 1 refills | Status: DC
Start: 2023-12-13 — End: 2023-12-20

## 2023-12-13 NOTE — Patient Instructions (Addendum)
 I appreciate the opportunity to provide care to you today!    Follow up:  1 months for nurse visit BP and 3 months f/u  Labs: please stop by the lab today to get your blood drawn (CBC, CMP, TSH, Lipid profile, HgA1c, Vit D)  Toenail Fungus: -You have been prescribed Terbinafine 250 mg daily for 12 weeks. -Do not start this medication until I've reviewed your liver enzyme test results and provided approval.  Non-pharmacologic recommendations include: -Keeping your feet clean and dry. -Wearing breathable footwear. -Changing socks regularly and avoiding tight shoes.  Chest Pain: -Please visit [facility] at your earliest convenience for a chest x-ray to check for any pulmonary (lungs) or cardiac (heart) abnormalities. -Strongly recommend that you completely stop smoking. -Begin taking low-dose aspirin (81 mg) daily, available over-the-counter. -Immediately visit the ER if chest pain occurs again, worsens, or is accompanied by other concerning symptoms such as shortness of breath or dizziness.   Hypertension Management  Your current blood pressure is above the target goal of <140/90 mmHg. To address this, please start taking Olmesartan 20mg  daily  Medication Instructions: Take your blood pressure medication at the same time each day. After taking your medication, check your blood pressure at least an hour later. If your first reading is >140/90 mmHg, wait at least 10 minutes and recheck your blood pressure. Side Effects: In the initial days of therapy, you may experience dizziness or lightheadedness as your body adjusts to the lower blood pressure; this is expected. Diet and Lifestyle: Adhere to a low-sodium diet, limiting intake to less than 1500 mg daily, and increase your physical activity. Avoid over-the-counter NSAIDs such as ibuprofen  and naproxen  while on this medication. Hydration and Nutrition: Stay well-hydrated by drinking at least 64 ounces of water daily. Increase your  servings of fruits and vegetables and avoid excessive sodium in your diet. Long-Term Considerations: Uncontrolled hypertension can increase the risk of cardiovascular diseases, including stroke, coronary artery disease, and heart failure.  Please report to the emergency department if your blood pressure exceeds 180/120 and is accompanied by symptoms such as headaches, chest pain, palpitations, blurred vision, or dizziness.     Please follow up if your symptoms worsen or fail to improve.    Please continue to a heart-healthy diet and increase your physical activities. Try to exercise for at least five days a week.    It was a pleasure to see you and I look forward to continuing to work together on your health and well-being. Please do not hesitate to call the office if you need care or have questions about your care.  In case of emergency, please visit the Emergency Department for urgent care, or contact our clinic at [phone] to schedule an appointment. We're here to help you!   Have a wonderful day and week. With Gratitude, Hashir Deleeuw MSN, FNP-BC

## 2023-12-13 NOTE — Progress Notes (Signed)
 New Patient Office Visit  Subjective:  Patient ID: Caleb Blevins, male    DOB: [birth year]  Age: 28 y.o. MRN: [id number]  CC:  Chief Complaint  Patient presents with   Establish Care   Hypertension    Has been having bad headaches in the forehead area, pulsating.    Chest Pain    Has been having episodes of chest pain. It hasn't happened in awhile but it started around Dec and it would happen occasionally but decreased when he cut back on smoking    Nail Problem    States he thinks he has a toe fungus that is causing his nails to come off     HPI Caleb Blevins is a 28 y.o. male with past medical history of low back pain and presents for establishing care. For the details of today's visit, please refer to the assessment and plan.    Past Medical History:  Diagnosis Date   Asthma     History reviewed. No pertinent surgical history.  Family History  Problem Relation Age of Onset   Heart disease Mother    Hypertension Mother     Social History   Socioeconomic History   Marital status: Single    Spouse name: Not on file   Number of children: Not on file   Years of education: Not on file   Highest education level: GED or equivalent  Occupational History   Not on file  Tobacco Use   Smoking status: Every Day    Current packs/day: 2.00    Average packs/day: 2.0 packs/day for 15.0 years (30.0 ttl pk-yrs)    Types: Cigars, Cigarettes   Smokeless tobacco: Never  Vaping Use   Vaping status: Never Used  Substance and Sexual Activity   Alcohol use: Yes    Comment: occasionally   Drug use: No   Sexual activity: Yes    Birth control/protection: Condom  Other Topics Concern   Not on file  Social History Narrative   Not on file   Social Drivers of Health   Financial Resource Strain: Low Risk  (12/13/2023)   Overall Financial Resource Strain (CARDIA)    Difficulty of Paying Living Expenses: Not very hard  Food Insecurity: No Food Insecurity (12/13/2023)    Hunger Vital Sign    Worried About Running Out of Food in the Last Year: Never true    Ran Out of Food in the Last Year: Never true  Transportation Needs: No Transportation Needs (12/13/2023)   PRAPARE - Administrator, Civil Service (Medical): No    Lack of Transportation (Non-Medical): No  Physical Activity: Inactive (12/13/2023)   Exercise Vital Sign    Days of Exercise per Week: 0 days    Minutes of Exercise per Session: Not on file  Stress: Stress Concern Present (12/13/2023)   [employer] of Occupational Health - Occupational Stress Questionnaire    Feeling of Stress: To some extent  Social Connections: Moderately Isolated (12/13/2023)   Social Connection and Isolation Panel    Frequency of Communication with Friends and Family: More than three times a week    Frequency of Social Gatherings with Friends and Family: Twice a week    Attends Religious Services: 1 to 4 times per year    Active Member of [employer] or Organizations: No    Attends Engineer, structural: Not on file    Marital Status: Never married  Intimate Partner Violence: Not on file  ROS Review of Systems  Constitutional:  Negative for fatigue and fever.  Eyes:  Negative for visual disturbance.  Respiratory:  Negative for chest tightness and shortness of breath.   Cardiovascular:  Positive for chest pain. Negative for palpitations.  Neurological:  Negative for dizziness and headaches.    Objective:   Today's Vitals: BP (!) 160/104   Pulse 72   Resp 16   Ht 5' 8 (1.727 m)   Wt 255 lb (115.7 kg)   SpO2 97%   BMI 38.77 kg/m   Physical Exam HENT:     Head: Normocephalic.     Right Ear: External ear normal.     Left Ear: External ear normal.     Nose: No congestion or rhinorrhea.     Mouth/Throat:     Mouth: Mucous membranes are moist.  Cardiovascular:     Rate and Rhythm: Regular rhythm.     Heart sounds: No murmur heard. Pulmonary:     Effort: No respiratory distress.      Breath sounds: Normal breath sounds.  Feet:     Right foot:     Toenail Condition: Right toenails are abnormally thick. Fungal disease present.    Left foot:     Toenail Condition: Left toenails are abnormally thick. Fungal disease present. Neurological:     Mental Status: He is alert.      Assessment & Plan:   Primary hypertension Assessment & Plan: Uncontrolled BP in the clinic today Will initiate therapy on Olmesartan  20mg  daily A low-sodium diet of less than 2,300 mg daily is recommended, along with moderate-intensity physical activity for at least 150 minutes per week. The patient is encouraged to maintain these lifestyle modifications to help manage her blood pressure effectively.  Long-term considerations were discussed, emphasizing that uncontrolled hypertension increases the risk of cardiovascular diseases, including stroke, coronary artery disease, and heart failure.  The patient is encouraged to seek emergency care if blood pressure exceeds 180/120 and is accompanied by symptoms such as headaches, chest pain, palpitations, blurred vision, or dizziness. She verbalized understanding and will follow up as scheduled.   Orders: -     EKG 12-Lead -     DG Chest 2 View -     Olmesartan  Medoxomil; Take 1 tablet (20 mg total) by mouth daily.  Dispense: 30 tablet; Refill: 1 -     CBC with Differential/Platelet -     CMP14+EGFR -     TSH -     Lipid panel -     VITAMIN D  25 Hydroxy (Vit-D Deficiency, Fractures) -     Hemoglobin A1c  Onychomycosis of toenail Assessment & Plan: Will initiate therapy with Terbinafine  250 mg daily for 12 weeks for suspected fungal infection. However, the patient is advised not to start the medication until liver enzyme test results have been reviewed and formal approval is provided to ensure safe initiation of therapy.     Orders: -     Terbinafine  HCl; Take 1 tablet (250 mg total) by mouth daily.  Dispense: 30 tablet; Refill: 2  Chest pain,  unspecified type Assessment & Plan: The patient presents with a history of intermittent chest pain, with the most recent episode occurring about a month ago. He notes that the pain sometimes occurs randomly, particularly while smoking cigars or occasionally while driving. He reports that he used to drink coffee daily but has not consumed any in the past three months. The chest pain is described as a sudden pressure sensation  in the center of the chest that lasts less than 5 seconds. It does not radiate and is not associated with exertion. The episodes are infrequent and self-resolving.  Obtain ECG to evaluate for cardiac rhythm abnormalities or ischemia. Order chest X-ray to assess for potential pulmonary or structural cardiac causes. Advise complete cessation of smoking and provide smoking cessation resources. Encouraged to start low-dose aspirin 81 mg daily  Educate the patient on recognizing warning signs of cardiac events, including prolonged chest pain, radiation to the arm or jaw, shortness of breath, dizziness, or sweating, and to seek immediate medical attention if these symptoms occur.    Tobacco use Assessment & Plan: Smokes about 1 pack can last 2 day  Asked about quitting: confirms that he currently smokes cigarettes Advise to quit smoking: Educated about QUITTING to reduce the risk of cancer, cardio and cerebrovascular disease. Assess willingness: Unwilling to quit at this time, but is working on cutting back. Assist with counseling and pharmacotherapy: Counseled for 5 minutes and literature provided. Arrange for follow up: follow up in 3 months and continue to offer help.     Note: This chart has been completed using Engineer, civil (consulting) software, and while attempts have been made to ensure accuracy, certain words and phrases may not be transcribed as intended.    Follow-up: Return in about 1 month (around 01/13/2024).   Kohei Antonellis, FNP

## 2023-12-14 LAB — CBC WITH DIFFERENTIAL/PLATELET
Basophils Absolute: 0 x10E3/uL (ref 0.0–0.2)
Basos: 0 %
EOS (ABSOLUTE): 0.5 x10E3/uL — ABNORMAL HIGH (ref 0.0–0.4)
Eos: 8 %
Hematocrit: 54.3 % — ABNORMAL HIGH (ref 37.5–51.0)
Hemoglobin: 18.4 g/dL — ABNORMAL HIGH (ref 13.0–17.7)
Immature Grans (Abs): 0 x10E3/uL (ref 0.0–0.1)
Immature Granulocytes: 0 %
Lymphocytes Absolute: 2 x10E3/uL (ref 0.7–3.1)
Lymphs: 32 %
MCH: 31.7 pg (ref 26.6–33.0)
MCHC: 33.9 g/dL (ref 31.5–35.7)
MCV: 94 fL (ref 79–97)
Monocytes Absolute: 0.6 x10E3/uL (ref 0.1–0.9)
Monocytes: 9 %
Neutrophils Absolute: 3.2 x10E3/uL (ref 1.4–7.0)
Neutrophils: 51 %
Platelets: 281 x10E3/uL (ref 150–450)
RBC: 5.81 x10E6/uL — ABNORMAL HIGH (ref 4.14–5.80)
RDW: 13.1 % (ref 11.6–15.4)
WBC: 6.4 x10E3/uL (ref 3.4–10.8)

## 2023-12-14 LAB — CMP14+EGFR
ALT: 27 IU/L (ref 0–44)
AST: 20 IU/L (ref 0–40)
Albumin: 3.5 g/dL — ABNORMAL LOW (ref 4.3–5.2)
Alkaline Phosphatase: 80 IU/L (ref 44–121)
BUN/Creatinine Ratio: 10 (ref 9–20)
BUN: 10 mg/dL (ref 6–20)
Bilirubin Total: 0.3 mg/dL (ref 0.0–1.2)
CO2: 21 mmol/L (ref 20–29)
Calcium: 8.8 mg/dL (ref 8.7–10.2)
Chloride: 103 mmol/L (ref 96–106)
Creatinine, Ser: 1.04 mg/dL (ref 0.76–1.27)
Globulin, Total: 2.4 g/dL (ref 1.5–4.5)
Glucose: 90 mg/dL (ref 70–99)
Potassium: 4.5 mmol/L (ref 3.5–5.2)
Sodium: 140 mmol/L (ref 134–144)
Total Protein: 5.9 g/dL — ABNORMAL LOW (ref 6.0–8.5)
eGFR: 99 mL/min/1.73 (ref >59)

## 2023-12-14 LAB — LIPID PANEL
Chol/HDL Ratio: 5 ratio (ref 0.0–5.0)
Cholesterol, Total: 254 mg/dL — ABNORMAL HIGH (ref 100–199)
HDL: 51 mg/dL (ref >39)
LDL Chol Calc (NIH): 169 mg/dL — ABNORMAL HIGH (ref 0–99)
Triglycerides: 184 mg/dL — ABNORMAL HIGH (ref 0–149)
VLDL Cholesterol Cal: 34 mg/dL (ref 5–40)

## 2023-12-14 LAB — HEMOGLOBIN A1C
Est. average glucose Bld gHb Est-mCnc: 108 mg/dL
Hgb A1c MFr Bld: 5.4 % (ref 4.8–5.6)

## 2023-12-14 LAB — TSH: TSH: 1.15 u[IU]/mL (ref 0.450–4.500)

## 2023-12-14 LAB — VITAMIN D 25 HYDROXY (VIT D DEFICIENCY, FRACTURES): Vit D, 25-Hydroxy: 17.3 ng/mL — ABNORMAL LOW (ref 30.0–100.0)

## 2023-12-17 ENCOUNTER — Telehealth: Payer: Self-pay | Admitting: Family Medicine

## 2023-12-17 DIAGNOSIS — I10 Essential (primary) hypertension: Secondary | ICD-10-CM | POA: Insufficient documentation

## 2023-12-17 DIAGNOSIS — Z720 Tobacco use: Secondary | ICD-10-CM | POA: Insufficient documentation

## 2023-12-17 DIAGNOSIS — B351 Tinea unguium: Secondary | ICD-10-CM | POA: Insufficient documentation

## 2023-12-17 DIAGNOSIS — R079 Chest pain, unspecified: Secondary | ICD-10-CM | POA: Insufficient documentation

## 2023-12-17 NOTE — Assessment & Plan Note (Signed)
 Uncontrolled BP in the clinic today Will initiate therapy on Olmesartan  20mg  daily A low-sodium diet of less than 2,300 mg daily is recommended, along with moderate-intensity physical activity for at least 150 minutes per week. The patient is encouraged to maintain these lifestyle modifications to help manage her blood pressure effectively.  Long-term considerations were discussed, emphasizing that uncontrolled hypertension increases the risk of cardiovascular diseases, including stroke, coronary artery disease, and heart failure.  The patient is encouraged to seek emergency care if blood pressure exceeds 180/120 and is accompanied by symptoms such as headaches, chest pain, palpitations, blurred vision, or dizziness. She verbalized understanding and will follow up as scheduled.

## 2023-12-17 NOTE — Assessment & Plan Note (Signed)
 Smokes about 1 pack can last 2 day  Asked about quitting: confirms that he currently smokes cigarettes Advise to quit smoking: Educated about QUITTING to reduce the risk of cancer, cardio and cerebrovascular disease. Assess willingness: Unwilling to quit at this time, but is working on cutting back. Assist with counseling and pharmacotherapy: Counseled for 5 minutes and literature provided. Arrange for follow up: follow up in 3 months and continue to offer help.

## 2023-12-17 NOTE — Assessment & Plan Note (Addendum)
 The patient presents with a history of intermittent chest pain, with the most recent episode occurring about a month ago. He notes that the pain sometimes occurs randomly, particularly while smoking cigars or occasionally while driving. He reports that he used to drink coffee daily but has not consumed any in the past three months. The chest pain is described as a sudden pressure sensation in the center of the chest that lasts less than 5 seconds. It does not radiate and is not associated with exertion. The episodes are infrequent and self-resolving.  Obtain ECG to evaluate for cardiac rhythm abnormalities or ischemia. Order chest X-ray to assess for potential pulmonary or structural cardiac causes. Advise complete cessation of smoking and provide smoking cessation resources. Encouraged to start low-dose aspirin 81 mg daily  Educate the patient on recognizing warning signs of cardiac events, including prolonged chest pain, radiation to the arm or jaw, shortness of breath, dizziness, or sweating, and to seek immediate medical attention if these symptoms occur.

## 2023-12-17 NOTE — Assessment & Plan Note (Signed)
 Will initiate therapy with Terbinafine  250 mg daily for 12 weeks for suspected fungal infection. However, the patient is advised not to start the medication until liver enzyme test results have been reviewed and formal approval is provided to ensure safe initiation of therapy.

## 2023-12-18 NOTE — Telephone Encounter (Signed)
 Mychart message sent.

## 2023-12-19 ENCOUNTER — Ambulatory Visit: Payer: Self-pay | Admitting: Family Medicine

## 2023-12-19 DIAGNOSIS — E559 Vitamin D deficiency, unspecified: Secondary | ICD-10-CM

## 2023-12-19 MED ORDER — VITAMIN D (ERGOCALCIFEROL) 1.25 MG (50000 UNIT) PO CAPS
50000.0000 [IU] | ORAL_CAPSULE | ORAL | 1 refills | Status: DC
Start: 2023-12-19 — End: 2023-12-20

## 2023-12-20 ENCOUNTER — Ambulatory Visit (HOSPITAL_COMMUNITY)
Admission: EM | Admit: 2023-12-20 | Discharge: 2023-12-20 | Disposition: A | Discharge status: Home / Self Care | Attending: Physician Assistant | Admitting: Physician Assistant

## 2023-12-20 ENCOUNTER — Encounter (HOSPITAL_COMMUNITY): Payer: Self-pay | Admitting: Emergency Medicine

## 2023-12-20 DIAGNOSIS — K0889 Other specified disorders of teeth and supporting structures: Secondary | ICD-10-CM | POA: Diagnosis not present

## 2023-12-20 DIAGNOSIS — K047 Periapical abscess without sinus: Secondary | ICD-10-CM

## 2023-12-20 MED ORDER — LIDOCAINE VISCOUS HCL 2 % MT SOLN
OROMUCOSAL | Status: AC
  Filled 2023-12-20: qty 15

## 2023-12-20 MED ORDER — IBUPROFEN 100 MG/5ML PO SUSP: 600.0000 mg | Freq: Three times a day (TID) | ORAL | 0 refills | Status: AC | PRN

## 2023-12-20 MED ORDER — AMOXICILLIN-POT CLAVULANATE 400-57 MG/5ML PO SUSR: 875.0000 mg | Freq: Two times a day (BID) | ORAL | 0 refills | Status: AC

## 2023-12-20 MED ORDER — LIDOCAINE VISCOUS HCL 2 % MT SOLN: 15.0000 mL | Freq: Four times a day (QID) | OROMUCOSAL | 0 refills | Status: AC | PRN

## 2023-12-20 MED ORDER — LIDOCAINE VISCOUS HCL 2 % MT SOLN
15.0000 mL | Freq: Once | OROMUCOSAL | Status: AC
  Administered 2023-12-20: 15 mL via OROMUCOSAL

## 2023-12-20 NOTE — Discharge Instructions (Signed)
 We treating you for dental infection.  Start Augmentin  twice daily for 10 days.  Gargle with warm salt water for pain relief.  Take ibuprofen  600 mg every 8 hours as needed.  Do not take additional NSAIDs with this medication including aspirin, ibuprofen /Advil , naproxen /Aleve .  Gargle with viscous lidocaine  and spit this out.  Do not eat or drink immediately after using this medication as it increases the risk of choking.  Follow-up with a dentist as we discussed.  If anything worsens and you have difficulty swallowing, swelling of your throat, shortness of breath, muffled voice you need to be seen immediately.

## 2023-12-20 NOTE — ED Triage Notes (Signed)
 Pt reports left lower wisdom tooth coming in and causing pain for 5 days. Tried numbing gel and Ibuprofen  without relief.  Pt also having sore throat for 5 days as well.

## 2023-12-20 NOTE — ED Provider Notes (Signed)
 [facility]    CSN: [id number] Arrival date & time: 12/20/23  1053      History   Chief Complaint Chief Complaint  Patient presents with   Dental Pain   Sore Throat    HPI FITZROY MIKAMI is a 28 y.o. male.   Patient presents today with a 5-day history of right lower jaw pain with associated sore throat and right otalgia.  Reports that pain is worse with swallowing, rated 10 on a 0-10 pain scale, described as sharp, no aggravating or alleviating factors identified.  He denies any recent dental procedure or tooth extraction.  He denies any known sick contacts or exposure to strep pharyngitis.  He has been able to eat and drink despite the symptoms though it is very painful.  He has been taking ibuprofen  and using Chloraseptic throat spray as well as numbing gel without improvement of symptoms.  Denies any additional symptoms including fever, nausea, vomiting, swelling of his throat, muffled voice, shortness of breath.  Denies any recent antibiotics in the past 90 days.    Past Medical History:  Diagnosis Date   Asthma     Patient Active Problem List   Diagnosis Date Noted   Primary hypertension 12/17/2023   Onychomycosis of toenail 12/17/2023   Chest pain 12/17/2023   Tobacco use 12/17/2023   Midline low back pain without sciatica 02/22/2015    History reviewed. No pertinent surgical history.     Home Medications    Prior to Admission medications   Medication Sig Start Date End Date Taking? Authorizing Provider  amoxicillin -clavulanate (AUGMENTIN ) 400-57 MG/5ML suspension Take 10.9 mLs (875 mg total) by mouth in the morning and at bedtime for 10 days. 12/20/23 12/30/23 Yes Parish Dubose K, PA-C  ibuprofen  (ADVIL ) 100 MG/5ML suspension Take 30 mLs (600 mg total) by mouth every 8 (eight) hours as needed. 12/20/23  Yes Juleah Paradise K, PA-C  lidocaine  (XYLOCAINE ) 2 % solution Use as directed 15 mLs in the mouth or throat every 6 (six) hours as needed for mouth  pain. Swish and spit 12/20/23  Yes Alicea Wente, Rocky POUR, PA-C    Family History Family History  Problem Relation Age of Onset   Heart disease Mother    Hypertension Mother     Social History Social History   Tobacco Use   Smoking status: Every Day    Current packs/day: 2.00    Average packs/day: 2.0 packs/day for 15.0 years (30.0 ttl pk-yrs)    Types: Cigars, Cigarettes   Smokeless tobacco: Never  Vaping Use   Vaping status: Never Used  Substance Use Topics   Alcohol use: Yes    Comment: occasionally   Drug use: No     Allergies   Patient has no known allergies.   Review of Systems Review of Systems  Constitutional:  Positive for activity change. Negative for appetite change, fatigue and fever.  HENT:  Positive for dental problem and sore throat. Negative for congestion, sinus pressure, sneezing, trouble swallowing and voice change.   Respiratory:  Negative for cough and shortness of breath.   Cardiovascular:  Negative for chest pain.  Gastrointestinal:  Negative for nausea and vomiting.  Neurological:  Negative for light-headedness and headaches.     Physical Exam Triage Vital Signs ED Triage Vitals  Encounter Vitals Group     BP 12/20/23 1130 (!) 144/109     Girls Systolic BP Percentile --      Girls Diastolic BP Percentile --  Boys Systolic BP Percentile --      Boys Diastolic BP Percentile --      Pulse Rate 12/20/23 1130 79     Resp 12/20/23 1130 14     Temp 12/20/23 1130 98.3 F (36.8 C)     Temp Source 12/20/23 1130 Oral     SpO2 12/20/23 1130 97 %     Weight --      Height --      Head Circumference --      Peak Flow --      Pain Score 12/20/23 1129 10     Pain Loc --      Pain Education --      Exclude from Growth Chart --    No data found.  Updated Vital Signs BP (!) 144/109 (BP Location: Right Arm)   Pulse 79   Temp 98.3 F (36.8 C) (Oral)   Resp 14   SpO2 97%   Visual Acuity Right Eye Distance:   Left Eye Distance:   Bilateral  Distance:    Right Eye Near:   Left Eye Near:    Bilateral Near:     Physical Exam Vitals reviewed.  Constitutional:      General: He is awake.     Appearance: Normal appearance. He is well-developed. He is not ill-appearing.     Comments: Very pleasant male appears stated age in no acute distress sitting comfortably in exam room  HENT:     Head: Normocephalic and atraumatic.     Right Ear: Tympanic membrane, ear canal and external ear normal. Tympanic membrane is not erythematous or bulging.     Left Ear: External ear normal. There is impacted cerumen.     Nose: Nose normal.     Mouth/Throat:     Dentition: Abnormal dentition. Gingival swelling and dental abscesses present.     Pharynx: Uvula midline. No posterior oropharyngeal erythema.      Comments: No evidence of Ludwig angina Cardiovascular:     Rate and Rhythm: Normal rate and regular rhythm.     Heart sounds: Normal heart sounds, S1 normal and S2 normal. No murmur heard. Pulmonary:     Effort: Pulmonary effort is normal.     Breath sounds: Normal breath sounds. No stridor. No wheezing, rhonchi or rales.     Comments: Clear to auscultation bilaterally Neurological:     Mental Status: He is alert.  Psychiatric:        Behavior: Behavior is cooperative.      UC Treatments / Results  Labs (all labs ordered are listed, but only abnormal results are displayed) Labs Reviewed - No data to display  EKG   Radiology No results found.  Procedures Procedures (including critical care time)  Medications Ordered in UC Medications  lidocaine  (XYLOCAINE ) 2 % viscous mouth solution 15 mL (15 mLs Mouth/Throat Given 12/20/23 1301)    Initial Impression / Assessment and Plan / UC Course  I have reviewed the triage vital signs and the nursing notes.  Pertinent labs & imaging results that were available during my care of the patient were reviewed by me and considered in my medical decision making (see chart for details).      Patient is well-appearing, afebrile, nontoxic, nontachycardic.  No indication for emergent evaluation or imaging.  Patient was treated for dental infection with Augmentin  twice daily for 10 days.  He was given ibuprofen  600 mg for pain relief with instruction to take this with food to prevent  GI upset.  He is to avoid use of additional NSAIDs.  Can use Tylenol  for breakthrough pain.  Recommend he gargle with warm salt water for additional symptom relief.  He was given viscous lidocaine  for additional symptom relief with instruction not to eat or drink immediately after using this medication as it increases the risk for choking.  No indication for dose adjustment based on metabolic panel from 12/13/2023 with creatinine of 1.04 and calculated creatinine clearance of 170 mL/min.  Discussed that ultimately he will need to see dentist to address underlying tooth.  If he develops any worsening symptoms including fever, nausea, vomiting, swelling of his throat, muffled voice, dysphagia he needs to go to the emergency room to which he expressed understanding.  Work excuse note provided.   Final Clinical Impressions(s) / UC Diagnoses   Final diagnoses:  Dental infection  Dentalgia     Discharge Instructions      We treating you for dental infection.  Start Augmentin  twice daily for 10 days.  Gargle with warm salt water for pain relief.  Take ibuprofen  600 mg every 8 hours as needed.  Do not take additional NSAIDs with this medication including aspirin, ibuprofen /Advil , naproxen /Aleve .  Gargle with viscous lidocaine  and spit this out.  Do not eat or drink immediately after using this medication as it increases the risk of choking.  Follow-up with a dentist as we discussed.  If anything worsens and you have difficulty swallowing, swelling of your throat, shortness of breath, muffled voice you need to be seen immediately.     ED Prescriptions     Medication Sig Dispense Auth. Provider    amoxicillin -clavulanate (AUGMENTIN ) 400-57 MG/5ML suspension Take 10.9 mLs (875 mg total) by mouth in the morning and at bedtime for 10 days. 218 mL Kayal Mula K, PA-C   lidocaine  (XYLOCAINE ) 2 % solution Use as directed 15 mLs in the mouth or throat every 6 (six) hours as needed for mouth pain. Swish and spit 100 mL Jorge Retz K, PA-C   ibuprofen  (ADVIL ) 100 MG/5ML suspension Take 30 mLs (600 mg total) by mouth every 8 (eight) hours as needed. 473 mL Mattie Novosel K, PA-C      PDMP not reviewed this encounter.   Sherrell Rocky POUR, PA-C 12/20/23 1308

## 2023-12-31 ENCOUNTER — Other Ambulatory Visit: Payer: Self-pay | Admitting: Family Medicine

## 2023-12-31 DIAGNOSIS — B351 Tinea unguium: Secondary | ICD-10-CM

## 2023-12-31 DIAGNOSIS — I10 Essential (primary) hypertension: Secondary | ICD-10-CM

## 2023-12-31 MED ORDER — TERBINAFINE HCL 250 MG PO TABS
250.0000 mg | ORAL_TABLET | Freq: Every day | ORAL | 1 refills | Status: AC
Start: 2023-12-31 — End: ?

## 2023-12-31 MED ORDER — OLMESARTAN MEDOXOMIL 20 MG PO TABS
20.0000 mg | ORAL_TABLET | Freq: Every day | ORAL | 1 refills | Status: DC
Start: 2023-12-31 — End: 2024-03-19

## 2024-01-01 ENCOUNTER — Other Ambulatory Visit: Payer: Self-pay | Admitting: Family Medicine

## 2024-01-01 DIAGNOSIS — I10 Essential (primary) hypertension: Secondary | ICD-10-CM

## 2024-01-17 ENCOUNTER — Ambulatory Visit

## 2024-01-20 ENCOUNTER — Ambulatory Visit

## 2024-02-22 IMAGING — CT CT ABD-PELV W/ CM
2 of 4 series · 16 of 46 positions shown, 18 images · IV contrast (agent unspecified)
Comparison: No comparison studies available.

CLINICAL DATA: Abdominal pain.  Vomiting.

EXAM:
CT ABDOMEN AND PELVIS WITH CONTRAST
TECHNIQUE: Multidetector CT imaging of the abdomen and pelvis was performed
using the standard protocol following bolus administration of
intravenous contrast.

[Series 3: abdomen 5.0 · axial · 0.86mm/px · z∈[+912,+1352]mm · 13 of 100 slices shown, 15 images]
[im 6/100  soft-tissue]
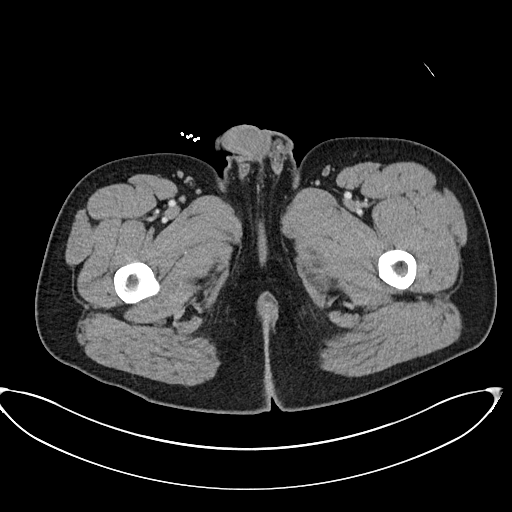
[im 6/100  bone]
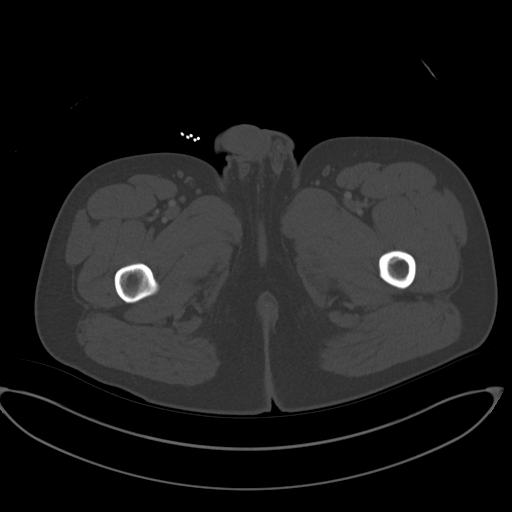
[im 16/100  soft-tissue]
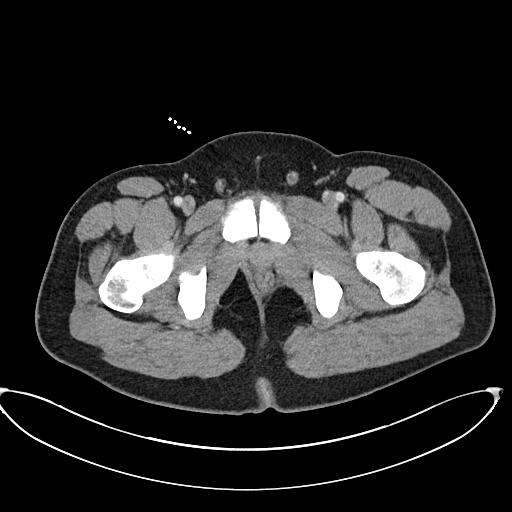
[im 21/100  soft-tissue]
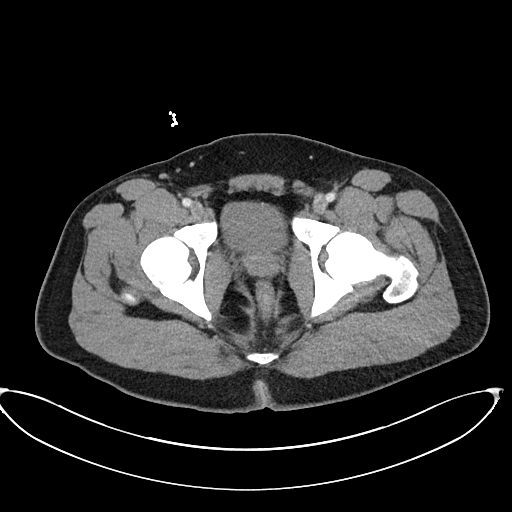
[im 27/100  soft-tissue]
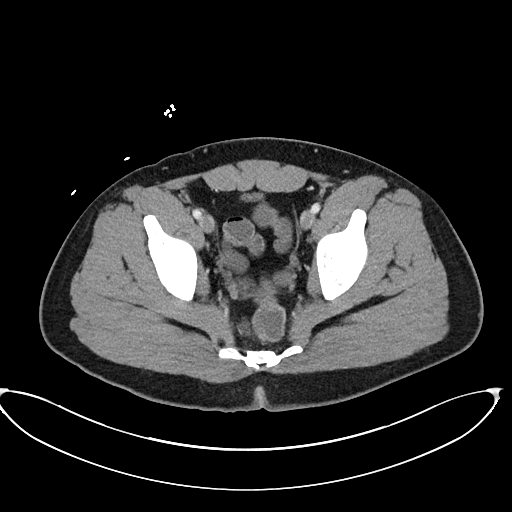
[im 37/100  soft-tissue]
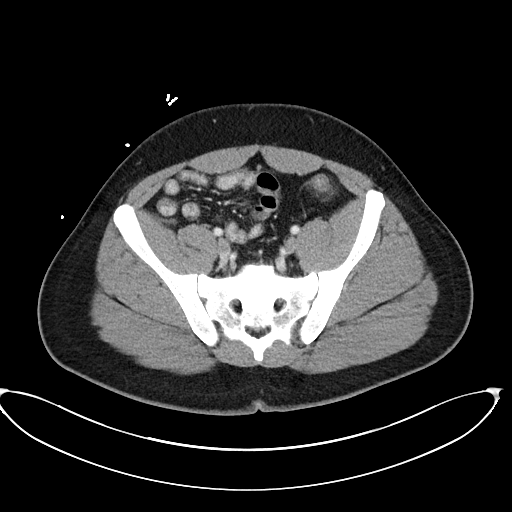
[im 42/100  soft-tissue]
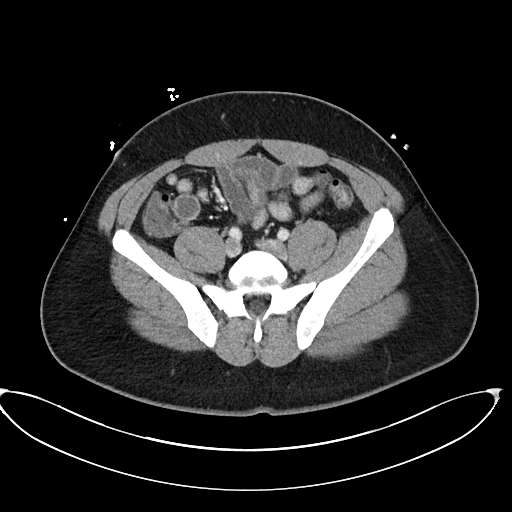
[im 53/100  soft-tissue]
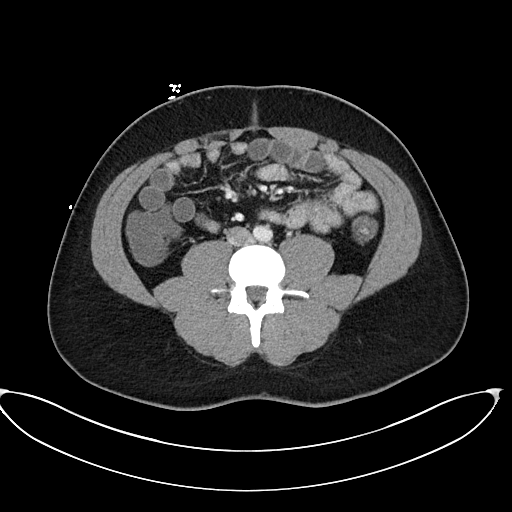
[im 58/100  soft-tissue]
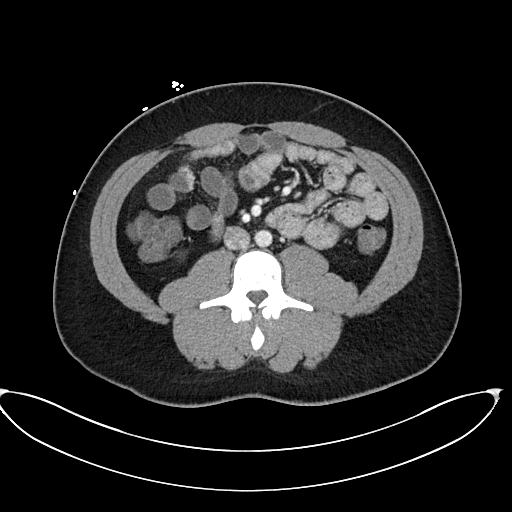
[im 63/100  soft-tissue]
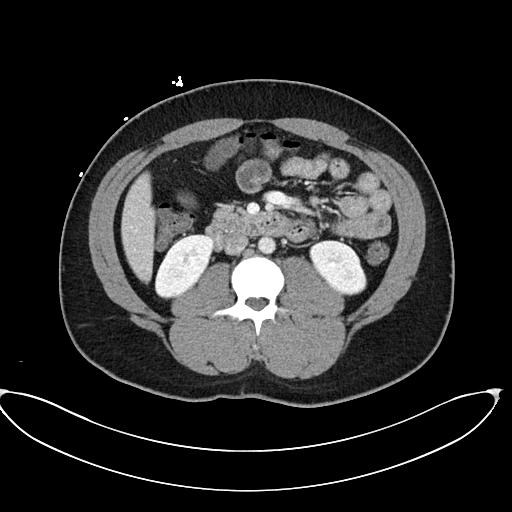
[im 63/100  bone]
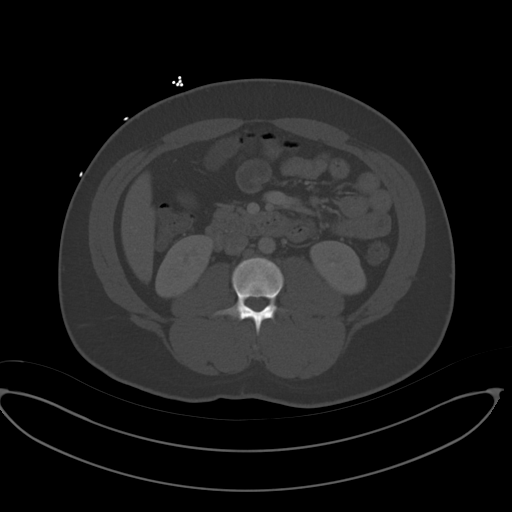
[im 73/100  soft-tissue]
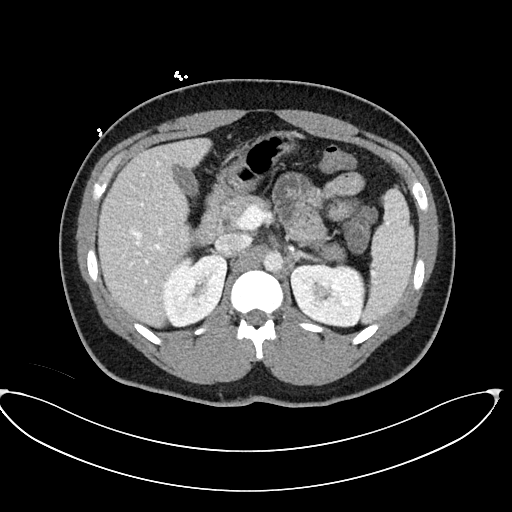
[im 79/100  soft-tissue]
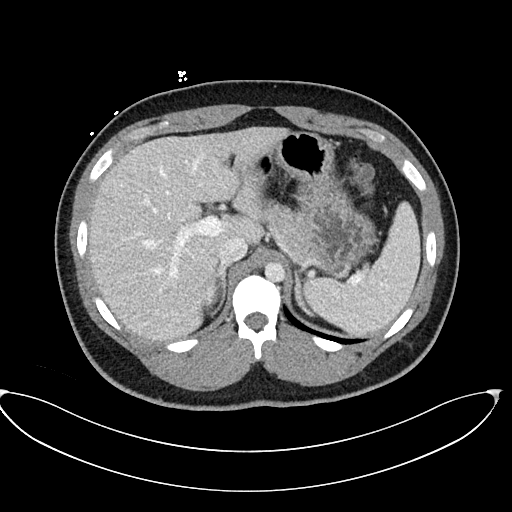
[im 84/100  soft-tissue]
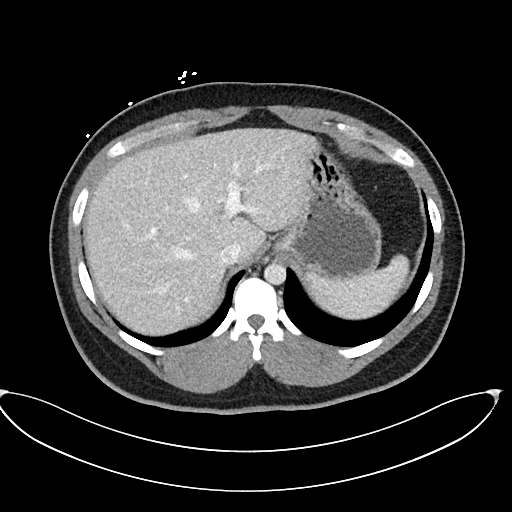
[im 94/100  soft-tissue]
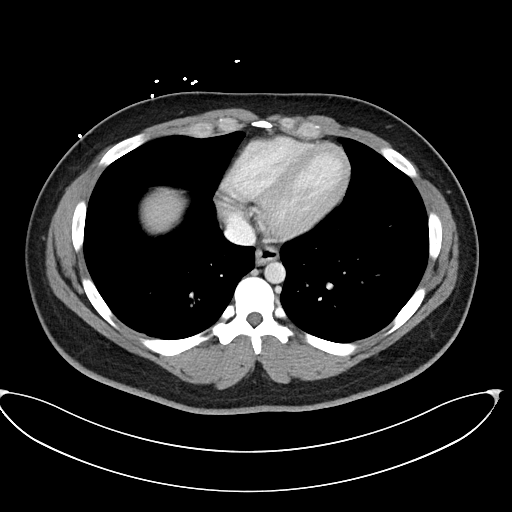

[Series 6: abdomen 3.0 mpr cor · coronal · 0.77mm/px · 3 of 101 slices shown]
[im 34/101  soft-tissue]
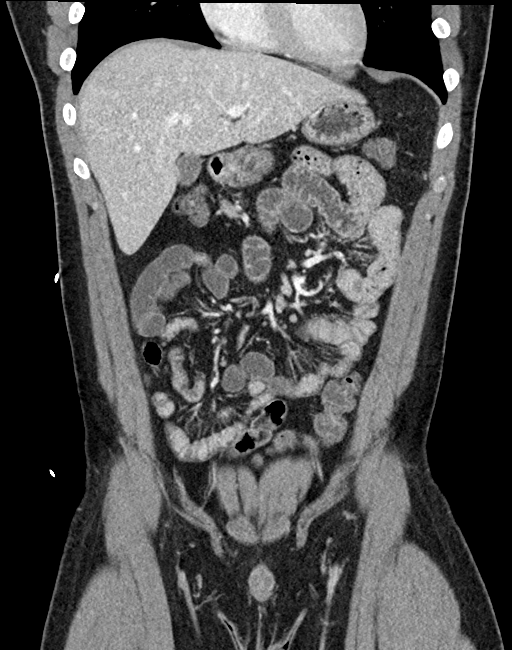
[im 45/101  soft-tissue]
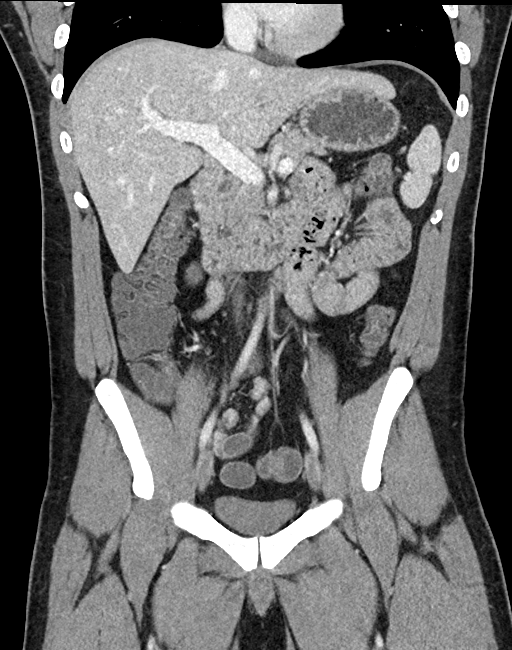
[im 56/101  soft-tissue]
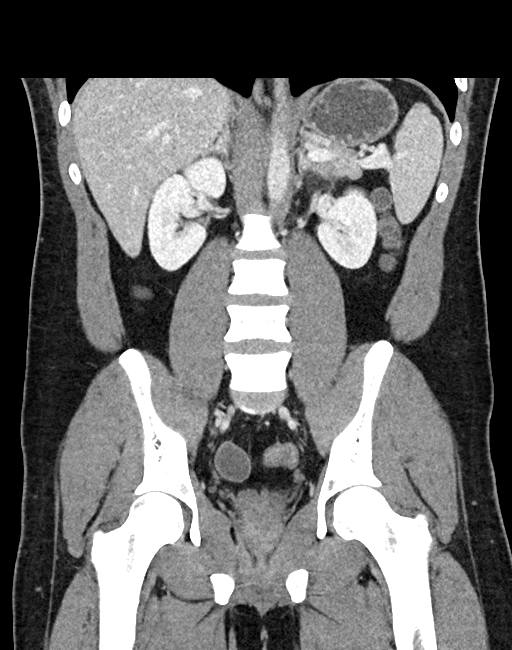

[16 of 46 positions shown; findings below may reference images not displayed]

RADIATION DOSE REDUCTION: This exam was performed according to the
departmental dose-optimization program which includes automated
exposure control, adjustment of the mA and/or kV according to
patient size and/or use of iterative reconstruction technique.

CONTRAST:  100mL OMNIPAQUE IOHEXOL 300 MG/ML  SOLN
FINDINGS: Lower chest: Unremarkable.

Hepatobiliary: No suspicious focal abnormality within the liver
parenchyma. There is no evidence for gallstones, gallbladder wall
thickening, or pericholecystic fluid. No intrahepatic or
extrahepatic biliary dilation.

Pancreas: No focal mass lesion. No dilatation of the main duct. No
intraparenchymal cyst. No peripancreatic edema.

Spleen: No splenomegaly. No focal mass lesion.

Adrenals/Urinary Tract: No adrenal nodule or mass. Kidneys
unremarkable. No evidence for hydroureter. The urinary bladder
appears normal for the degree of distention.

Stomach/Bowel: Stomach is unremarkable. No gastric wall thickening.
No evidence of outlet obstruction. Duodenum is normally positioned
as is the ligament of Treitz. No small bowel wall thickening. No
small bowel dilatation. The terminal ileum is normal. The appendix
is normal. No gross colonic mass. No colonic wall thickening.

Vascular/Lymphatic: No abdominal aortic aneurysm. There is no
gastrohepatic or hepatoduodenal ligament lymphadenopathy. No
retroperitoneal or mesenteric lymphadenopathy. No pelvic sidewall
lymphadenopathy.

Reproductive: The prostate gland and seminal vesicles are
unremarkable.

Other: No intraperitoneal free fluid.

Musculoskeletal: No worrisome lytic or sclerotic osseous
abnormality.
IMPRESSION: No acute findings in the abdomen or pelvis. Specifically, no
findings to explain the patient's history of abdominal pain with
vomiting.

## 2024-02-22 IMAGING — DX DG CHEST 1V PORT
1 series · 1 of 1 positions shown · non-contrast
Comparison: Chest radiographs 01/25/2008.

CLINICAL DATA: 26-year-old male with hematemesis.

EXAM:
PORTABLE CHEST 1 VIEW

[chest]
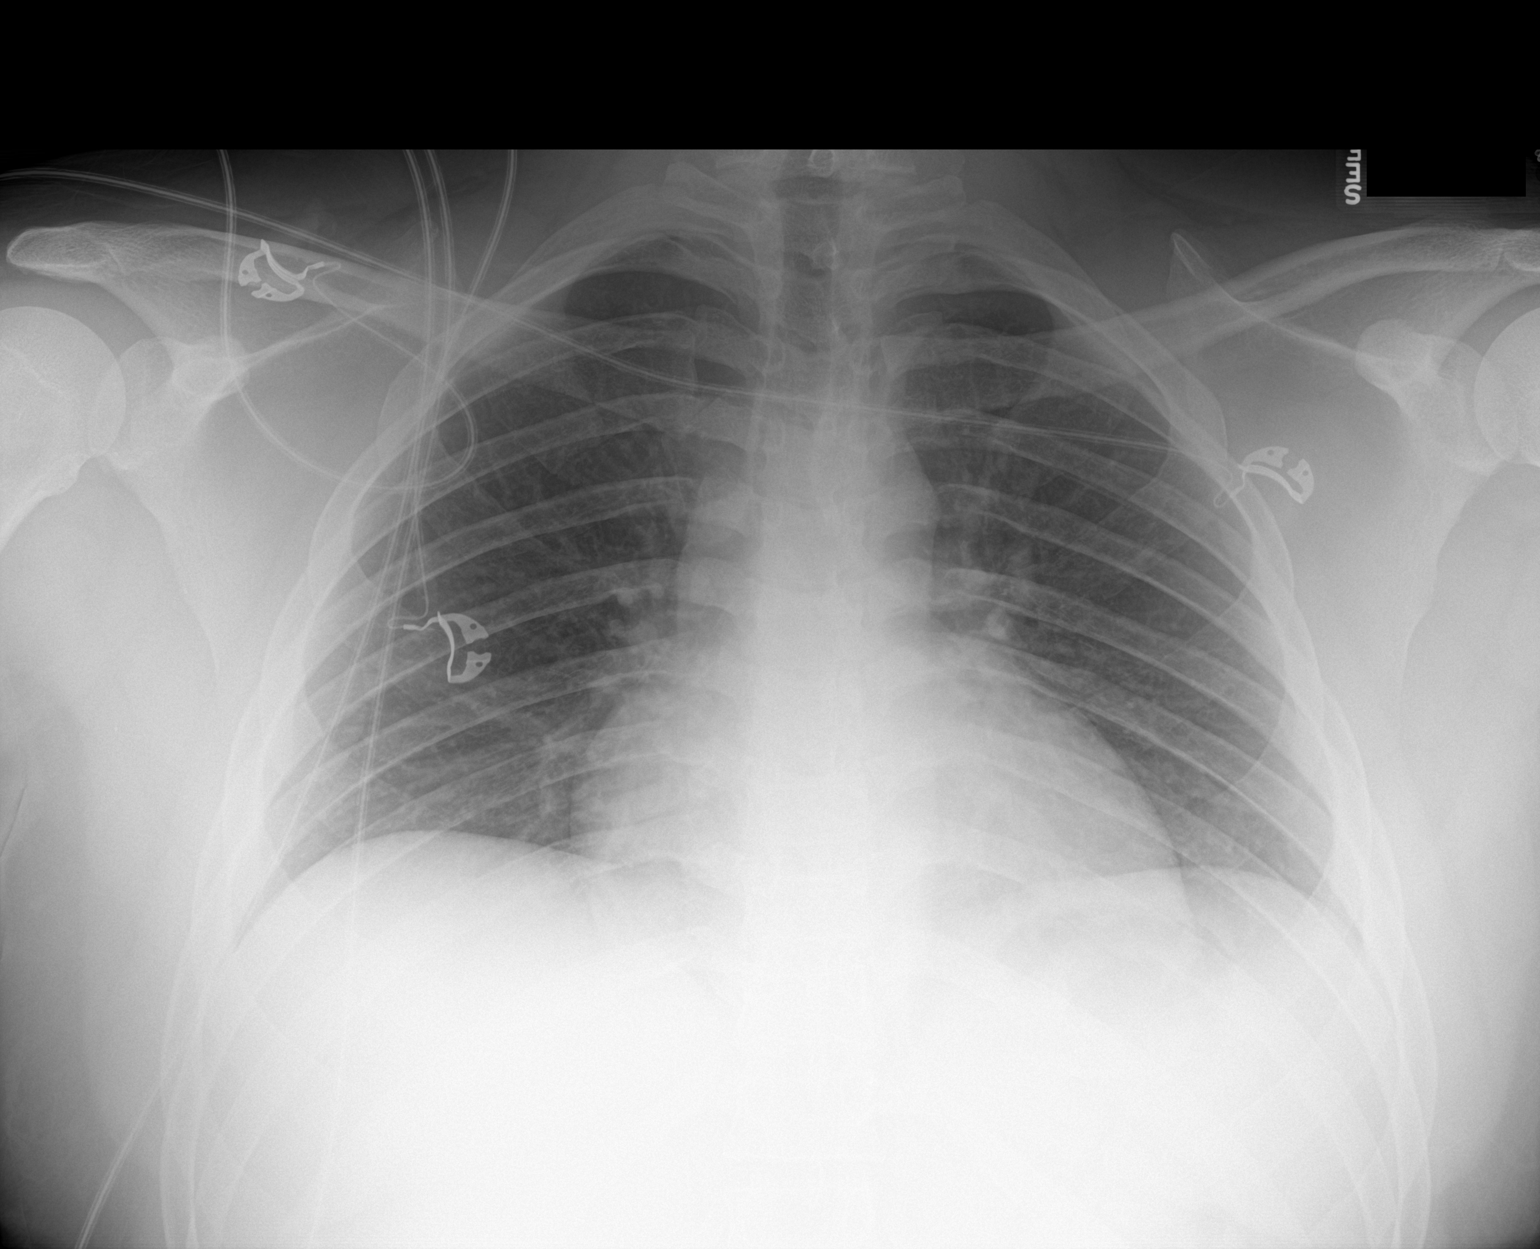

[1 of 1 positions shown; findings below may reference images not displayed]

FINDINGS: Portable AP semi upright view at 6666 hours. Low lung volumes.
Mediastinal contours remain within normal limits. Visualized
tracheal air column is within normal limits. Mild crowding of lung
markings but otherwise Allowing for portable technique the lungs are
clear. No pneumothorax. No pneumoperitoneum. Paucity of bowel gas in
the upper abdomen. No osseous abnormality identified.
IMPRESSION: Low lung volumes, otherwise no cardiopulmonary abnormality.

## 2024-03-19 ENCOUNTER — Ambulatory Visit: Admitting: Family Medicine

## 2024-03-19 ENCOUNTER — Encounter: Payer: Self-pay | Admitting: Family Medicine

## 2024-03-19 VITALS — BP 178/120 | HR 81 | Ht 68.0 in | Wt 253.0 lb

## 2024-03-19 DIAGNOSIS — Z1159 Encounter for screening for other viral diseases: Secondary | ICD-10-CM

## 2024-03-19 DIAGNOSIS — I10 Essential (primary) hypertension: Secondary | ICD-10-CM | POA: Diagnosis not present

## 2024-03-19 MED ORDER — OLMESARTAN MEDOXOMIL 40 MG PO TABS: 40.0000 mg | ORAL_TABLET | Freq: Every day | ORAL | 1 refills | Status: DC

## 2024-03-19 NOTE — Assessment & Plan Note (Signed)
 Uncontrolled blood pressure noted in clinic today. The patient reports poor dietary habits and minimal physical activity. 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 Increase Olmesartan  to 40 mg PO daily. Encouraged adherence to a low-sodium diet and incorporation of regular physical activity as tolerated. Discussed the importance of consistent medication use, stress management, and maintaining a regular sleep schedule despite night-shift work. Advised to seek immediate medical attention if blood pressure exceeds 180/120 mmHg and is accompanied by headache, chest pain, palpitations, blurred vision, or dizziness. Will continue to monitor BP closely and reassess at next follow-up. Patient verbalized understanding.

## 2024-03-19 NOTE — Progress Notes (Signed)
 Established Patient Office Visit  Subjective:  Patient ID: Caleb Blevins, male    DOB: 1996/03/16  Age: 28 y.o. MRN: [id number]  CC:  Chief Complaint  Patient presents with   Hypertension    Three month follow up    HPI Caleb Blevins is a 28 y.o. male with past medical history of hypertension presents for blood pressure f/u.  For the details of today's visit, please refer to the assessment and plan.    Past Medical History:  Diagnosis Date   Asthma     No past surgical history on file.  Family History  Problem Relation Age of Onset   Heart disease Mother    Hypertension Mother     Social History   Socioeconomic History   Marital status: Single    Spouse name: Not on file   Number of children: Not on file   Years of education: Not on file   Highest education level: GED or equivalent  Occupational History   Not on file  Tobacco Use   Smoking status: Every Day    Current packs/day: 2.00    Average packs/day: 2.0 packs/day for 15.0 years (30.0 ttl pk-yrs)    Types: Cigars, Cigarettes   Smokeless tobacco: Never  Vaping Use   Vaping status: Never Used  Substance and Sexual Activity   Alcohol use: Yes    Comment: occasionally   Drug use: No   Sexual activity: Yes    Birth control/protection: Condom  Other Topics Concern   Not on file  Social History Narrative   Not on file   Social Drivers of Health   Financial Resource Strain: Low Risk  (12/13/2023)   Overall Financial Resource Strain (CARDIA)    Difficulty of Paying Living Expenses: Not very hard  Food Insecurity: No Food Insecurity (12/13/2023)   Hunger Vital Sign    Worried About Running Out of Food in the Last Year: Never true    Ran Out of Food in the Last Year: Never true  Transportation Needs: No Transportation Needs (12/13/2023)   PRAPARE - Administrator, Civil Service (Medical): No    Lack of Transportation (Non-Medical): No  Physical Activity: Inactive (12/13/2023)    Exercise Vital Sign    Days of Exercise per Week: 0 days    Minutes of Exercise per Session: Not on file  Stress: Stress Concern Present (12/13/2023)   [employer] of Occupational Health - Occupational Stress Questionnaire    Feeling of Stress: To some extent  Social Connections: Moderately Isolated (12/13/2023)   Social Connection and Isolation Panel    Frequency of Communication with Friends and Family: More than three times a week    Frequency of Social Gatherings with Friends and Family: Twice a week    Attends Religious Services: 1 to 4 times per year    Active Member of [employer] or Organizations: No    Attends Engineer, Structural: Not on file    Marital Status: Never married  Intimate Partner Violence: Not on file    Outpatient Medications Prior to Visit  Medication Sig Dispense Refill   ibuprofen  (ADVIL ) 100 MG/5ML suspension Take 30 mLs (600 mg total) by mouth every 8 (eight) hours as needed. 473 mL 0   lidocaine  (XYLOCAINE ) 2 % solution Use as directed 15 mLs in the mouth or throat every 6 (six) hours as needed for mouth pain. Swish and spit 100 mL 0   terbinafine  (LAMISIL ) 250 MG tablet Take  1 tablet (250 mg total) by mouth daily. 90 tablet 1   olmesartan  (BENICAR ) 20 MG tablet Take 1 tablet (20 mg total) by mouth daily. 30 tablet 1   No facility-administered medications prior to visit.    No Known Allergies  ROS Review of Systems  Constitutional:  Negative for fatigue and fever.  Eyes:  Negative for visual disturbance.  Respiratory:  Negative for chest tightness and shortness of breath.   Cardiovascular:  Negative for chest pain and palpitations.  Neurological:  Negative for dizziness and headaches.      Objective:    Physical Exam HENT:     Head: Normocephalic.     Right Ear: External ear normal.     Left Ear: External ear normal.     Nose: No congestion or rhinorrhea.     Mouth/Throat:     Mouth: Mucous membranes are moist.  Cardiovascular:      Rate and Rhythm: Regular rhythm.     Heart sounds: No murmur heard. Pulmonary:     Effort: No respiratory distress.     Breath sounds: Normal breath sounds.  Neurological:     Mental Status: He is alert.     BP (!) 178/120   Pulse 81   Ht 5' 8 (1.727 m)   Wt 253 lb (114.8 kg)   SpO2 97%   BMI 38.47 kg/m  Wt Readings from Last 3 Encounters:  03/19/24 253 lb (114.8 kg)  12/13/23 255 lb (115.7 kg)  07/01/22 235 lb (106.6 kg)    Lab Results  Component Value Date   TSH 1.150 12/13/2023   Lab Results  Component Value Date   WBC 6.4 12/13/2023   HGB 18.4 (H) 12/13/2023   HCT 54.3 (H) 12/13/2023   MCV 94 12/13/2023   PLT 281 12/13/2023   Lab Results  Component Value Date   NA 140 12/13/2023   K 4.5 12/13/2023   CO2 21 12/13/2023   GLUCOSE 90 12/13/2023   BUN 10 12/13/2023   CREATININE 1.04 12/13/2023   BILITOT 0.3 12/13/2023   ALKPHOS 80 12/13/2023   AST 20 12/13/2023   ALT 27 12/13/2023   PROT 5.9 (L) 12/13/2023   ALBUMIN 3.5 (L) 12/13/2023   CALCIUM 8.8 12/13/2023   ANIONGAP 11 08/09/2021   EGFR 99 12/13/2023   Lab Results  Component Value Date   CHOL 254 (H) 12/13/2023   Lab Results  Component Value Date   HDL 51 12/13/2023   Lab Results  Component Value Date   LDLCALC 169 (H) 12/13/2023   Lab Results  Component Value Date   TRIG 184 (H) 12/13/2023   Lab Results  Component Value Date   CHOLHDL 5.0 12/13/2023   Lab Results  Component Value Date   HGBA1C 5.4 12/13/2023      Assessment & Plan:  Primary hypertension Assessment & Plan: Uncontrolled blood pressure noted in clinic today. The patient reports poor dietary habits and minimal physical activity. 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 Increase Olmesartan  to 40 mg PO daily. Encouraged adherence to a low-sodium diet and incorporation of regular physical activity as tolerated. Discussed the importance of consistent  medication use, stress management, and maintaining a regular sleep schedule despite night-shift work. Advised to seek immediate medical attention if blood pressure exceeds 180/120 mmHg and is accompanied by headache, chest pain, palpitations, blurred vision, or dizziness. Will continue to monitor BP closely and reassess at next follow-up. Patient verbalized  understanding.    Orders: -     Olmesartan  Medoxomil; Take 1 tablet (40 mg total) by mouth daily.  Dispense: 30 tablet; Refill: 1 -     BMP8+EGFR  Need for hepatitis C screening test -     Hepatitis C antibody  Note: This chart has been completed using Engineer, Civil (consulting) software, and while attempts have been made to ensure accuracy, certain words and phrases may not be transcribed as intended.    Follow-up: Return in about 5 months (around 08/17/2024).   Joycie Aerts  Z Bacchus, FNP

## 2024-03-19 NOTE — Patient Instructions (Addendum)
 I appreciate the opportunity to provide care to you today!    Follow up:  5 months/  4 weeks with RN for BP reassessment   Labs: please stop by the lab today to get your blood drawn (BMP)   Hypertension Management  Your current blood pressure is above the target goal of <140/90 mmHg. To address this, please start taking Olmesartan  40 mg daily.   Medication Instructions: Take your blood pressure medication at the same time each day. After taking your medication, check your blood pressure at least an hour later. If your first reading is >140/90 mmHg, wait at least 10 minutes and recheck your blood pressure. Side Effects: In the initial days of therapy, you may experience dizziness or lightheadedness as your body adjusts to the lower blood pressure; this is expected. Diet and Lifestyle: Adhere to a low-sodium diet, limiting intake to less than 1500 mg daily, and increase your physical activity. Avoid over-the-counter NSAIDs such as ibuprofen  and naproxen  while on this medication. Hydration and Nutrition: Stay well-hydrated by drinking at least 64 ounces of water daily. Increase your servings of fruits and vegetables and avoid excessive sodium in your diet. Long-Term Considerations: Uncontrolled hypertension can increase the risk of cardiovascular diseases, including stroke, coronary artery disease, and heart failure.  Please report to the emergency department if your blood pressure exceeds 180/120 and is accompanied by symptoms such as headaches, chest pain, palpitations, blurred vision, or dizziness.    Please follow up if your symptoms worsen or fail to improve.    Attached with your AVS, you will find valuable resources for self-education. I highly recommend dedicating some time to thoroughly examine them.   Please continue to a heart-healthy diet and increase your physical activities. Try to exercise for at least five days a week.    It was a pleasure to see you and I look  forward to continuing to work together on your health and well-being. Please do not hesitate to call the office if you need care or have questions about your care.  In case of emergency, please visit the Emergency Department for urgent care, or contact our clinic at [phone] to schedule an appointment. We're here to help you!   Have a wonderful day and week. With Gratitude, Meade JENEANE Gerlach MSN, FNP-BC, PMHNP-BC

## 2024-03-20 LAB — BMP8+EGFR
BUN/Creatinine Ratio: 9 (ref 9–20)
BUN: 11 mg/dL (ref 6–20)
CO2: 26 mmol/L (ref 20–29)
Calcium: 9.1 mg/dL (ref 8.7–10.2)
Chloride: 102 mmol/L (ref 96–106)
Creatinine, Ser: 1.23 mg/dL (ref 0.76–1.27)
Glucose: 76 mg/dL (ref 70–99)
Potassium: 4.4 mmol/L (ref 3.5–5.2)
Sodium: 141 mmol/L (ref 134–144)
eGFR: 81 mL/min/1.73 (ref >59)

## 2024-03-20 LAB — HEPATITIS C ANTIBODY: Hep C Virus Ab: NONREACTIVE

## 2024-03-23 ENCOUNTER — Ambulatory Visit: Payer: Self-pay | Admitting: Family Medicine

## 2024-03-23 NOTE — Progress Notes (Signed)
Please inform the patient that his labs are stable

## 2024-04-17 ENCOUNTER — Ambulatory Visit

## 2024-04-17 VITALS — BP 168/112

## 2024-04-17 DIAGNOSIS — I10 Essential (primary) hypertension: Secondary | ICD-10-CM

## 2024-04-17 NOTE — Progress Notes (Signed)
 Patient is in office today for a nurse visit for Blood Pressure Check. Patient blood pressure was 169/103 then 168/112, Patient No chest pain, No shortness of breath, No dyspnea on exertion

## 2024-04-21 ENCOUNTER — Other Ambulatory Visit: Payer: Self-pay | Admitting: Family Medicine

## 2024-04-21 DIAGNOSIS — I10 Essential (primary) hypertension: Secondary | ICD-10-CM

## 2024-04-21 MED ORDER — OLMESARTAN-AMLODIPINE-HCTZ 40-5-12.5 MG PO TABS: 1.0000 | ORAL_TABLET | Freq: Every day | ORAL | 1 refills | Status: DC

## 2024-04-27 ENCOUNTER — Telehealth: Payer: Self-pay

## 2024-04-27 ENCOUNTER — Other Ambulatory Visit: Payer: Self-pay | Admitting: Family Medicine

## 2024-04-27 DIAGNOSIS — I10 Essential (primary) hypertension: Secondary | ICD-10-CM

## 2024-04-27 MED ORDER — AMLODIPINE BESYLATE 5 MG PO TABS: 5.0000 mg | ORAL_TABLET | Freq: Every day | ORAL | 1 refills | Status: AC

## 2024-04-27 MED ORDER — OLMESARTAN MEDOXOMIL 40 MG PO TABS: 40.0000 mg | ORAL_TABLET | Freq: Every day | ORAL | 0 refills | Status: AC

## 2024-04-27 MED ORDER — HYDROCHLOROTHIAZIDE 12.5 MG PO TABS: 12.5000 mg | ORAL_TABLET | Freq: Every day | ORAL | 3 refills | Status: AC

## 2024-04-27 NOTE — Telephone Encounter (Signed)
 Copied from CRM #[id number]. Topic: Clinical - Prescription Issue >> Apr 27, 2024  2:51 PM Everette C wrote: Reason for CRM: The patient would like to speak with a member of clinical staff about their prescription for Olmesartan -amLODIPine -HCTZ 40-5-12.5 MG TABS [[id number]] and the associated cost. The patient was notified by their pharmacy of their prescription but shares that the cost was higher than anticipated roughly $30. Please contact when possible

## 2024-04-28 NOTE — Telephone Encounter (Signed)
 Please inform the patient that the combination medication has been separated to assist with affordability, and the new prescriptions have been sent to the pharmacy.

## 2024-04-28 NOTE — Telephone Encounter (Signed)
 Message sent to patient

## 2024-06-19 ENCOUNTER — Emergency Department (HOSPITAL_COMMUNITY)

## 2024-06-19 ENCOUNTER — Encounter (HOSPITAL_COMMUNITY): Payer: Self-pay | Admitting: *Deleted

## 2024-06-19 ENCOUNTER — Emergency Department (HOSPITAL_COMMUNITY)
Admission: EM | Admit: 2024-06-19 | Discharge: 2024-06-19 | Disposition: A | Discharge status: Home / Self Care | Source: Home / Self Care | Attending: Emergency Medicine | Admitting: Emergency Medicine

## 2024-06-19 ENCOUNTER — Other Ambulatory Visit: Payer: Self-pay

## 2024-06-19 DIAGNOSIS — R079 Chest pain, unspecified: Secondary | ICD-10-CM

## 2024-06-19 LAB — CBC
HCT: 44.6 % (ref 39.0–52.0)
Hemoglobin: 15.9 g/dL (ref 13.0–17.0)
MCH: 30.7 pg (ref 26.0–34.0)
MCHC: 35.7 g/dL (ref 30.0–36.0)
MCV: 86.1 fL (ref 80.0–100.0)
Platelets: 323 10*3/uL (ref 150–400)
RBC: 5.18 MIL/uL (ref 4.22–5.81)
RDW: 12.1 % (ref 11.5–15.5)
WBC: 6.8 10*3/uL (ref 4.0–10.5)
nRBC: 0 % (ref 0.0–0.2)

## 2024-06-19 LAB — BASIC METABOLIC PANEL WITH GFR
Anion gap: 11 (ref 5–15)
BUN: 14 mg/dL (ref 6–20)
CO2: 23 mmol/L (ref 22–32)
Calcium: 8.5 mg/dL — ABNORMAL LOW (ref 8.9–10.3)
Chloride: 104 mmol/L (ref 98–111)
Creatinine, Ser: 1.18 mg/dL (ref 0.61–1.24)
GFR, Estimated: >60 mL/min
Glucose, Bld: 100 mg/dL — ABNORMAL HIGH (ref 70–99)
Potassium: 3.8 mmol/L (ref 3.5–5.1)
Sodium: 139 mmol/L (ref 135–145)

## 2024-06-19 LAB — TROPONIN T, HIGH SENSITIVITY
Troponin T High Sensitivity: 6 ng/L (ref 0–19)
Troponin T High Sensitivity: 6 ng/L (ref 0–19)

## 2024-06-19 MED ORDER — NAPROXEN 375 MG PO TABS: 375.0000 mg | ORAL_TABLET | Freq: Two times a day (BID) | ORAL | 0 refills | Status: AC

## 2024-06-19 NOTE — Discharge Instructions (Signed)
 The tests today did not show any signs of serious problems with your heart or lungs.  Take the medications to help with pain and discomfort.  Follow-up with your primary care doctor to be rechecked

## 2024-06-19 NOTE — ED Triage Notes (Signed)
 Patient arrives POV for chest pain and left shoulder pain. Patient endorses pain has been occurring for about a year and a half but worsening over the last week. Patient endorses pain is worse in shoulder with movement. Patient endorses dizziness occurs sometimes but not currently bothersome. Hx of asthma. Pain 7/10 and sharp in nature.

## 2024-06-19 NOTE — ED Provider Triage Note (Signed)
 Emergency Medicine Provider Triage Evaluation Note  Caleb Blevins , a 29 y.o. male  was evaluated in triage.  Pt complains of chest pain.  Patient reports that he has been having chest pain on and off for about a year and a half.  He reports that for about a week he has noticed the pain is more constant.  He describes it comes and goes but feels like something is pulling at his chest.  He denies any shortness of breath however when the pain comes he does report that it takes his breath away for a second.  Pain is not reproducible on palpation.  Patient is well-appearing and in no acute distress.  Review of Systems  Positive: Chest pain Negative:   Physical Exam  BP (!) 150/103 (BP Location: Right Arm)   Pulse 84   Temp 98.3 F (36.8 C) (Oral)   Resp (!) 21   Ht 5' 8 (1.727 m)   Wt 115.7 kg   SpO2 98%   BMI 38.78 kg/m  Gen:   Awake, no distress   Resp:  Normal effort  MSK:   Moves extremities without difficulty  Other:    Medical Decision Making  Medically screening exam initiated at 5:44 PM.  Appropriate orders placed.  Caleb Blevins was informed that the remainder of the evaluation will be completed by another provider, this initial triage assessment does not replace that evaluation, and the importance of remaining in the ED until their evaluation is complete.     Caleb Blevins, [place] 06/19/24 1746

## 2024-06-19 NOTE — ED Provider Notes (Signed)
 " [place] [facility] Provider Note   CSN: [id number] Arrival date & time: 06/19/24  1706     Patient presents with: Chest Pain and Shoulder Pain   Caleb Blevins is a 29 y.o. male.    Chest Pain Shoulder Pain    Patient has a history of hypertension and asthma.  He presents to the ED with complaints of chest pain.  Patient states he said symptoms on and off for a while but for the last week or so he has had more constant pain in his chest.  It is sharp cage and feels like a pulling.  It sometimes moves into his shoulder.  He denies any recent fevers or chills.  No coughing.  No leg swelling.  Prior to Admission medications  Medication Sig Start Date End Date Taking? Authorizing Provider  naproxen  (NAPROSYN ) 375 MG tablet Take 1 tablet (375 mg total) by mouth 2 (two) times daily. 06/19/24  Yes Randol Simmonds, MD  amLODipine  (NORVASC ) 5 MG tablet Take 1 tablet (5 mg total) by mouth daily. 04/27/24   Bacchus, Meade PEDLAR, FNP  hydrochlorothiazide  (HYDRODIURIL ) 12.5 MG tablet Take 1 tablet (12.5 mg total) by mouth daily. 04/27/24   Bacchus, Meade PEDLAR, FNP  ibuprofen  (ADVIL ) 100 MG/5ML suspension Take 30 mLs (600 mg total) by mouth every 8 (eight) hours as needed. 12/20/23   Raspet, Erin K, PA-C  lidocaine  (XYLOCAINE ) 2 % solution Use as directed 15 mLs in the mouth or throat every 6 (six) hours as needed for mouth pain. Swish and spit 12/20/23   Raspet, Erin K, PA-C  olmesartan  (BENICAR ) 40 MG tablet Take 1 tablet (40 mg total) by mouth daily. 04/27/24   Bacchus, Meade PEDLAR, FNP  terbinafine  (LAMISIL ) 250 MG tablet Take 1 tablet (250 mg total) by mouth daily. 12/31/23   Bacchus, Meade PEDLAR, FNP    Allergies: Patient has no known allergies.    Review of Systems  Cardiovascular:  Positive for chest pain.    Updated Vital Signs BP (!) 150/103 (BP Location: Right Arm)   Pulse 84   Temp 98.3 F (36.8 C) (Oral)   Resp (!) 21   Ht 1.727 m (5' 8)   Wt 115.7 kg    SpO2 98%   BMI 38.78 kg/m   Physical Exam Vitals and nursing note reviewed.  Constitutional:      General: He is not in acute distress.    Appearance: He is well-developed.  HENT:     Head: Normocephalic and atraumatic.     Right Ear: External ear normal.     Left Ear: External ear normal.  Eyes:     General: No scleral icterus.       Right eye: No discharge.        Left eye: No discharge.     Conjunctiva/sclera: Conjunctivae normal.  Neck:     Trachea: No tracheal deviation.  Cardiovascular:     Rate and Rhythm: Normal rate.  Pulmonary:     Effort: Pulmonary effort is normal. No respiratory distress.     Breath sounds: No stridor.  Abdominal:     General: There is no distension.  Musculoskeletal:        General: No swelling or deformity.     Cervical back: Neck supple.  Skin:    General: Skin is warm and dry.     Findings: No rash.  Neurological:     Mental Status: He is alert. Mental status is  at baseline.     Cranial Nerves: No dysarthria or facial asymmetry.     Motor: No seizure activity.     (all labs ordered are listed, but only abnormal results are displayed) Labs Reviewed  BASIC METABOLIC PANEL WITH GFR - Abnormal; Notable for the following components:      Result Value   Glucose, Bld 100 (*)    Calcium 8.5 (*)    All other components within normal limits  CBC  TROPONIN T, HIGH SENSITIVITY  TROPONIN T, HIGH SENSITIVITY    EKG: EKG Interpretation Date/Time:  Friday June 19 2024 17:19:24 EST Ventricular Rate:  83 PR Interval:  160 QRS Duration:  90 QT Interval:  352 QTC Calculation: 413 R Axis:   23  Text Interpretation: Normal sinus rhythm Nonspecific T wave abnormality Abnormal ECG No previous ECGs available Confirmed by Randol Simmonds [phone]) on 06/19/2024 6:24:48 PM  Radiology: DG Shoulder Left Result Date: 06/19/2024 EXAM: 3 VIEW(S) XRAY OF THE LEFT SHOULDER 06/19/2024 07:06:56 PM COMPARISON: None available. CLINICAL HISTORY: Atraumatic  shoulder and chest pain. FINDINGS: BONES AND JOINTS: Glenohumeral joint is normally aligned. No acute fracture. No malalignment. The [facility] joint is unremarkable. SOFT TISSUES: No abnormal calcifications. Visualized lung is unremarkable. IMPRESSION: 1. No acute findings. Electronically signed by: Pinkie Pebbles MD 06/19/2024 07:11 PM EST RP Workstation: [id number]   DG Chest 2 View Result Date: 06/19/2024 EXAM: 2 VIEW(S) XRAY OF THE CHEST 06/19/2024 07:06:56 PM COMPARISON: 12/13/2023 CLINICAL HISTORY: Chest pain. FINDINGS: LUNGS AND PLEURA: Shallow inspiration. No focal pulmonary opacity. No pleural effusion. No pneumothorax. HEART AND MEDIASTINUM: No acute abnormality of the cardiac and mediastinal silhouettes. BONES AND SOFT TISSUES: No acute osseous abnormality. IMPRESSION: 1. No acute cardiopulmonary abnormality. Electronically signed by: Pinkie Pebbles MD 06/19/2024 07:11 PM EST RP Workstation: [id number]     Procedures   Medications Ordered in the ED - No data to display  Clinical Course as of 06/19/24 2107  [facility] Jun 19, 2024  1914 CBC Normal [JK]  2053 Troponin T, High Sensitivity Troponin normal [JK]  2053 Basic metabolic panel(!) Metabolic panel normal [JK]  2053 CBC BC normal [JK]  2055 Chest x-ray and shoulder x-ray without acute findings [JK]    Clinical Course User Index [JK] Randol Simmonds, MD                                 Medical Decision Making Problems Addressed: Chest pain, unspecified type: acute illness or injury that poses a threat to life or bodily functions  Amount and/or Complexity of Data Reviewed Labs: ordered. Decision-making details documented in ED Course. Radiology: ordered and independent interpretation performed.  Risk Prescription drug management.   Patient presented to the ED for evaluation of chest pain.  Symptoms ongoing for a while.  No clear precipitating factor.  Patient has a low risk heart score.  EKG does show nonspecific T wave changes  but overall reassuring.  Troponin is normal.  Doubt ACS  Patient is not having any shortness of breath.  PERC negative doubt PE.  Etiology of symptoms unclear but ED workup is reassuring.  Will try course of NSAIDs.  Recommend outpatient follow-up with PCP to be rechecked     Final diagnoses:  Chest pain, unspecified type    ED Discharge Orders          Ordered    naproxen  (NAPROSYN ) 375 MG tablet  2 times daily  06/19/24 2105               Randol Simmonds, MD 06/19/24 2107  "

## 2024-08-27 ENCOUNTER — Ambulatory Visit: Admitting: Family Medicine
